# Patient Record
Sex: Female | Born: 1970 | Race: Black or African American | Hispanic: No | Smoking: Never smoker
Health system: Southern US, Community
[De-identification: ages and names within clinical notes are randomized; demographics above are authoritative.]

## PROBLEM LIST (undated history)

## (undated) DIAGNOSIS — E669 Obesity, unspecified: Secondary | ICD-10-CM

## (undated) DIAGNOSIS — I10 Essential (primary) hypertension: Secondary | ICD-10-CM

## (undated) HISTORY — PX: REDUCTION MAMMAPLASTY: SUR839

## (undated) HISTORY — DX: Essential (primary) hypertension: I10

## (undated) HISTORY — DX: Obesity, unspecified: E66.9

## (undated) HISTORY — PX: BREAST REDUCTION SURGERY: SHX8

---

## 1999-08-21 ENCOUNTER — Ambulatory Visit (HOSPITAL_BASED_OUTPATIENT_CLINIC_OR_DEPARTMENT_OTHER): Admission: RE | Admit: 1999-08-21 | Discharge: 1999-08-22 | Payer: Self-pay | Admitting: Specialist

## 1999-10-17 ENCOUNTER — Other Ambulatory Visit: Admission: RE | Admit: 1999-10-17 | Discharge: 1999-10-17 | Payer: Self-pay | Admitting: Obstetrics & Gynecology

## 2000-05-08 ENCOUNTER — Encounter: Admission: RE | Admit: 2000-05-08 | Discharge: 2000-05-15 | Payer: Self-pay | Admitting: Family Medicine

## 2000-11-26 ENCOUNTER — Other Ambulatory Visit: Admission: RE | Admit: 2000-11-26 | Discharge: 2000-11-26 | Payer: Self-pay | Admitting: Obstetrics & Gynecology

## 2001-12-02 ENCOUNTER — Other Ambulatory Visit: Admission: RE | Admit: 2001-12-02 | Discharge: 2001-12-02 | Payer: Self-pay | Admitting: Obstetrics & Gynecology

## 2001-12-05 ENCOUNTER — Encounter: Payer: Self-pay | Admitting: Obstetrics & Gynecology

## 2001-12-05 ENCOUNTER — Encounter: Admission: RE | Admit: 2001-12-05 | Discharge: 2001-12-05 | Payer: Self-pay | Admitting: Obstetrics & Gynecology

## 2003-03-08 ENCOUNTER — Other Ambulatory Visit: Admission: RE | Admit: 2003-03-08 | Discharge: 2003-03-08 | Payer: Self-pay | Admitting: Obstetrics & Gynecology

## 2004-03-13 ENCOUNTER — Other Ambulatory Visit: Admission: RE | Admit: 2004-03-13 | Discharge: 2004-03-13 | Payer: Self-pay | Admitting: Obstetrics & Gynecology

## 2004-05-04 ENCOUNTER — Other Ambulatory Visit: Admission: RE | Admit: 2004-05-04 | Discharge: 2004-05-04 | Payer: Self-pay | Admitting: Obstetrics and Gynecology

## 2005-03-21 ENCOUNTER — Other Ambulatory Visit: Admission: RE | Admit: 2005-03-21 | Discharge: 2005-03-21 | Payer: Self-pay | Admitting: Obstetrics & Gynecology

## 2005-03-27 ENCOUNTER — Encounter: Admission: RE | Admit: 2005-03-27 | Discharge: 2005-03-27 | Payer: Self-pay | Admitting: Obstetrics & Gynecology

## 2011-04-02 ENCOUNTER — Encounter: Payer: Self-pay | Admitting: Family Medicine

## 2011-04-02 DIAGNOSIS — Z6837 Body mass index (BMI) 37.0-37.9, adult: Secondary | ICD-10-CM | POA: Insufficient documentation

## 2011-04-02 DIAGNOSIS — E669 Obesity, unspecified: Secondary | ICD-10-CM | POA: Insufficient documentation

## 2011-04-02 DIAGNOSIS — E66812 Obesity, class 2: Secondary | ICD-10-CM | POA: Insufficient documentation

## 2012-02-29 ENCOUNTER — Other Ambulatory Visit: Payer: Self-pay | Admitting: Obstetrics & Gynecology

## 2012-02-29 ENCOUNTER — Other Ambulatory Visit: Payer: Self-pay | Admitting: Internal Medicine

## 2012-02-29 DIAGNOSIS — Z9889 Other specified postprocedural states: Secondary | ICD-10-CM

## 2012-02-29 DIAGNOSIS — Z1231 Encounter for screening mammogram for malignant neoplasm of breast: Secondary | ICD-10-CM

## 2012-03-31 ENCOUNTER — Ambulatory Visit: Payer: Self-pay

## 2012-04-24 ENCOUNTER — Ambulatory Visit
Admission: RE | Admit: 2012-04-24 | Discharge: 2012-04-24 | Disposition: A | Discharge status: Home / Self Care | Payer: BC Managed Care – PPO | Source: Ambulatory Visit | Attending: Obstetrics & Gynecology | Admitting: Obstetrics & Gynecology

## 2012-04-24 DIAGNOSIS — Z9889 Other specified postprocedural states: Secondary | ICD-10-CM

## 2012-04-24 DIAGNOSIS — Z1231 Encounter for screening mammogram for malignant neoplasm of breast: Secondary | ICD-10-CM

## 2013-09-17 ENCOUNTER — Emergency Department (HOSPITAL_COMMUNITY)
Admission: EM | Admit: 2013-09-17 | Discharge: 2013-09-17 | Disposition: A | Discharge status: Home / Self Care | Payer: 59 | Source: Home / Self Care | Attending: Family Medicine | Admitting: Family Medicine

## 2013-09-17 ENCOUNTER — Encounter (HOSPITAL_COMMUNITY): Payer: Self-pay | Admitting: Emergency Medicine

## 2013-09-17 DIAGNOSIS — I10 Essential (primary) hypertension: Secondary | ICD-10-CM

## 2013-09-17 DIAGNOSIS — T7840XA Allergy, unspecified, initial encounter: Secondary | ICD-10-CM

## 2013-09-17 DIAGNOSIS — T50995A Adverse effect of other drugs, medicaments and biological substances, initial encounter: Secondary | ICD-10-CM

## 2013-09-17 MED ORDER — METHYLPREDNISOLONE ACETATE 40 MG/ML IJ SUSP
80.0000 mg | Freq: Once | INTRAMUSCULAR | Status: AC
  Administered 2013-09-17: 80 mg via INTRAMUSCULAR

## 2013-09-17 MED ORDER — METHYLPREDNISOLONE ACETATE 80 MG/ML IJ SUSP
INTRAMUSCULAR | Status: AC
  Filled 2013-09-17: qty 1

## 2013-09-17 NOTE — ED Notes (Signed)
generalised  Rash  For  2  Days  After  Taking  Clindamycin    Pt  Stopped  sev  Days         Also    Has  Been taking  Benadryl

## 2013-09-17 NOTE — ED Provider Notes (Signed)
CSN: [id number]     Arrival date & time 09/17/13  1329 History   First MD Initiated Contact with Patient 09/17/13 1504     Chief Complaint  Patient presents with  . Rash   (Consider location/radiation/quality/duration/timing/severity/associated sxs/prior Treatment) Patient is a 43 y.o. female presenting with rash. The history is provided by the patient, the spouse and a relative.  Rash Location:  Full body Quality: itchiness and redness   Severity:  Mild Onset quality:  Gradual Duration:  2 days Progression:  Unchanged Chronicity:  New Context: medications   Context comment:  Took clindamycin wed thru sun for flu sx, started with rash yest., no swelling or resp problems. Ineffective treatments:  Antihistamines Associated symptoms: no diarrhea, no nausea, no shortness of breath, no throat swelling, no tongue swelling and not wheezing     Past Medical History  Diagnosis Date  . Obesity    Past Surgical History  Procedure Laterality Date  . Breast reduction surgery     History reviewed. No pertinent family history. History  Substance Use Topics  . Smoking status: Never Smoker   . Smokeless tobacco: Not on file  . Alcohol Use: Yes   OB History   Grav Para Term Preterm Abortions TAB SAB Ect Mult Living                 Review of Systems  Constitutional: Negative.   HENT: Positive for trouble swallowing.   Respiratory: Negative for shortness of breath and wheezing.   Gastrointestinal: Negative for nausea and diarrhea.  Skin: Positive for rash.    Allergies  Review of patient's allergies indicates no known allergies.  Home Medications   Current Outpatient Rx  Name  Route  Sig  Dispense  Refill  . drospirenone-ethinyl estradiol (YASMIN) 3-0.03 MG per tablet   Oral   Take 1 tablet by mouth daily.           . phentermine 15 MG capsule   Oral   Take 15 mg by mouth every morning.            BP 130/95  Pulse 96  Temp(Src) 97.3 F (36.3 C) (Oral)  Resp 19   SpO2 97%  LMP 09/05/2013 Physical Exam  Nursing note and vitals reviewed. Constitutional: She is oriented to person, place, and time. She appears well-developed and well-nourished. No distress.  HENT:  Head: Normocephalic.  Right Ear: External ear normal.  Left Ear: External ear normal.  Mouth/Throat: Oropharynx is clear and moist.  Neck: Normal range of motion. Neck supple.  Cardiovascular: Regular rhythm.   Pulmonary/Chest: Effort normal and breath sounds normal. She has no wheezes.  Lymphadenopathy:    She has no cervical adenopathy.  Neurological: She is alert and oriented to person, place, and time.  Skin: Skin is warm and dry.    ED Course  Procedures (including critical care time) Labs Review Labs Reviewed - No data to display Imaging Review No results found.  EKG Interpretation    Date/Time:    Ventricular Rate:    PR Interval:    QRS Duration:   QT Interval:    QTC Calculation:   R Axis:     Text Interpretation:              MDM      Linna HoffJames D Marquon Alcala, MD 09/17/13 1520

## 2013-09-17 NOTE — Discharge Instructions (Signed)
Continue benadryl as needed, no more clindamycin, return as needed.

## 2017-02-25 DIAGNOSIS — I10 Essential (primary) hypertension: Secondary | ICD-10-CM | POA: Diagnosis not present

## 2017-02-25 DIAGNOSIS — Z6841 Body Mass Index (BMI) 40.0 and over, adult: Secondary | ICD-10-CM | POA: Diagnosis not present

## 2017-03-14 DIAGNOSIS — Z6837 Body mass index (BMI) 37.0-37.9, adult: Secondary | ICD-10-CM | POA: Diagnosis not present

## 2017-03-14 DIAGNOSIS — Z01419 Encounter for gynecological examination (general) (routine) without abnormal findings: Secondary | ICD-10-CM | POA: Diagnosis not present

## 2017-04-11 DIAGNOSIS — Z6838 Body mass index (BMI) 38.0-38.9, adult: Secondary | ICD-10-CM | POA: Diagnosis not present

## 2017-04-11 DIAGNOSIS — Z30430 Encounter for insertion of intrauterine contraceptive device: Secondary | ICD-10-CM | POA: Diagnosis not present

## 2017-04-18 DIAGNOSIS — Z6828 Body mass index (BMI) 28.0-28.9, adult: Secondary | ICD-10-CM | POA: Diagnosis not present

## 2017-04-18 DIAGNOSIS — Z1231 Encounter for screening mammogram for malignant neoplasm of breast: Secondary | ICD-10-CM | POA: Diagnosis not present

## 2017-06-24 DIAGNOSIS — Z920 Personal history of contraception: Secondary | ICD-10-CM | POA: Diagnosis not present

## 2017-06-24 DIAGNOSIS — R7309 Other abnormal glucose: Secondary | ICD-10-CM | POA: Diagnosis not present

## 2017-06-24 DIAGNOSIS — E559 Vitamin D deficiency, unspecified: Secondary | ICD-10-CM | POA: Diagnosis not present

## 2017-06-24 DIAGNOSIS — Z6841 Body Mass Index (BMI) 40.0 and over, adult: Secondary | ICD-10-CM | POA: Diagnosis not present

## 2017-06-24 DIAGNOSIS — E669 Obesity, unspecified: Secondary | ICD-10-CM | POA: Diagnosis not present

## 2017-06-24 DIAGNOSIS — Z975 Presence of (intrauterine) contraceptive device: Secondary | ICD-10-CM | POA: Diagnosis not present

## 2017-06-24 DIAGNOSIS — Z23 Encounter for immunization: Secondary | ICD-10-CM | POA: Diagnosis not present

## 2017-06-24 DIAGNOSIS — I10 Essential (primary) hypertension: Secondary | ICD-10-CM | POA: Diagnosis not present

## 2017-08-21 DIAGNOSIS — J019 Acute sinusitis, unspecified: Secondary | ICD-10-CM | POA: Diagnosis not present

## 2017-08-21 DIAGNOSIS — R05 Cough: Secondary | ICD-10-CM | POA: Diagnosis not present

## 2017-08-30 DIAGNOSIS — Z6841 Body Mass Index (BMI) 40.0 and over, adult: Secondary | ICD-10-CM | POA: Diagnosis not present

## 2017-11-19 DIAGNOSIS — I10 Essential (primary) hypertension: Secondary | ICD-10-CM | POA: Diagnosis not present

## 2017-11-19 DIAGNOSIS — E669 Obesity, unspecified: Secondary | ICD-10-CM | POA: Diagnosis not present

## 2017-11-19 DIAGNOSIS — E559 Vitamin D deficiency, unspecified: Secondary | ICD-10-CM | POA: Diagnosis not present

## 2017-11-19 DIAGNOSIS — Z0000 Encounter for general adult medical examination without abnormal findings: Secondary | ICD-10-CM | POA: Diagnosis not present

## 2017-11-19 DIAGNOSIS — Z6839 Body mass index (BMI) 39.0-39.9, adult: Secondary | ICD-10-CM | POA: Diagnosis not present

## 2018-02-05 DIAGNOSIS — R7309 Other abnormal glucose: Secondary | ICD-10-CM | POA: Diagnosis not present

## 2018-02-05 DIAGNOSIS — Z6839 Body mass index (BMI) 39.0-39.9, adult: Secondary | ICD-10-CM | POA: Diagnosis not present

## 2018-02-05 DIAGNOSIS — I10 Essential (primary) hypertension: Secondary | ICD-10-CM | POA: Diagnosis not present

## 2018-04-18 DIAGNOSIS — I10 Essential (primary) hypertension: Secondary | ICD-10-CM | POA: Diagnosis not present

## 2018-04-18 DIAGNOSIS — E559 Vitamin D deficiency, unspecified: Secondary | ICD-10-CM | POA: Diagnosis not present

## 2018-04-18 DIAGNOSIS — R7309 Other abnormal glucose: Secondary | ICD-10-CM | POA: Diagnosis not present

## 2018-04-18 LAB — BASIC METABOLIC PANEL
BUN: 12 (ref 4–21)
Creatinine: 0.8 (ref 0.5–1.1)
GLUCOSE: 81
Potassium: 4.3 (ref 3.4–5.3)
SODIUM: 140 (ref 137–147)

## 2018-04-18 LAB — CBC AND DIFFERENTIAL
HCT: 40 (ref 36–46)
HEMOGLOBIN: 12.5 (ref 12.0–16.0)
WBC: 8.3

## 2018-04-18 LAB — HEMOGLOBIN A1C: Hemoglobin A1C: 5.8

## 2018-05-30 DIAGNOSIS — R0981 Nasal congestion: Secondary | ICD-10-CM | POA: Diagnosis not present

## 2018-05-30 DIAGNOSIS — J029 Acute pharyngitis, unspecified: Secondary | ICD-10-CM | POA: Diagnosis not present

## 2018-05-30 DIAGNOSIS — T7849XA Other allergy, initial encounter: Secondary | ICD-10-CM | POA: Diagnosis not present

## 2018-06-14 ENCOUNTER — Encounter: Payer: Self-pay | Admitting: Nurse Practitioner

## 2018-06-14 DIAGNOSIS — I10 Essential (primary) hypertension: Secondary | ICD-10-CM | POA: Insufficient documentation

## 2018-06-14 DIAGNOSIS — R7309 Other abnormal glucose: Secondary | ICD-10-CM | POA: Insufficient documentation

## 2018-06-14 DIAGNOSIS — E559 Vitamin D deficiency, unspecified: Secondary | ICD-10-CM | POA: Insufficient documentation

## 2018-06-23 ENCOUNTER — Ambulatory Visit (INDEPENDENT_AMBULATORY_CARE_PROVIDER_SITE_OTHER): Payer: BLUE CROSS/BLUE SHIELD | Admitting: Nurse Practitioner

## 2018-06-23 ENCOUNTER — Encounter: Payer: Self-pay | Admitting: Nurse Practitioner

## 2018-06-23 VITALS — BP 126/88 | HR 93 | Temp 98.4°F | Ht 67.0 in | Wt 238.2 lb

## 2018-06-23 DIAGNOSIS — E669 Obesity, unspecified: Secondary | ICD-10-CM

## 2018-06-23 DIAGNOSIS — Z6837 Body mass index (BMI) 37.0-37.9, adult: Secondary | ICD-10-CM

## 2018-06-23 DIAGNOSIS — R7309 Other abnormal glucose: Secondary | ICD-10-CM

## 2018-06-23 DIAGNOSIS — E66812 Obesity, class 2: Secondary | ICD-10-CM

## 2018-06-23 DIAGNOSIS — I10 Essential (primary) hypertension: Secondary | ICD-10-CM | POA: Diagnosis not present

## 2018-06-23 DIAGNOSIS — Z23 Encounter for immunization: Secondary | ICD-10-CM | POA: Diagnosis not present

## 2018-06-23 MED ORDER — LIRAGLUTIDE -WEIGHT MANAGEMENT 18 MG/3ML ~~LOC~~ SOPN: 3.0000 mg | PEN_INJECTOR | Freq: Every day | SUBCUTANEOUS | 1 refills | Status: DC

## 2018-06-23 MED ORDER — TELMISARTAN-HCTZ 40-12.5 MG PO TABS: 1.0000 | ORAL_TABLET | Freq: Every day | ORAL | 1 refills | Status: DC

## 2018-06-23 NOTE — Progress Notes (Signed)
  Subjective:     Patient ID: Jane Armstrong , female    DOB: 01-03-1971 , 47 y.o.   MRN: [id number]   Weight Check - 47 y/o female here for weight check.  Taking her Saxenda 3mg  and tolerating well.  Exercising 5 days per week with strength training.  Eating healthy diet with Herbalife (1 shake per day).  Drinking approximately 3 - 16 oz bottles per day.     Past Medical History:  Diagnosis Date  . Obesity       Current Outpatient Medications:  .  EPINEPHrine (EPIPEN 2-PAK) 0.3 mg/0.3 mL IJ SOAJ injection, Inject into the muscle once. Use as directed, Disp: , Rfl:  .  ibuprofen (ADVIL,MOTRIN) 800 MG tablet, Take 800 mg by mouth 3 (three) times daily., Disp: , Rfl:  .  Liraglutide -Weight Management (SAXENDA) 18 MG/3ML SOPN, Inject into the skin. Inject 3mg  by subcutaneous route everyday in the abdomen, thigh or upper arm, Disp: , Rfl:  .  telmisartan-hydrochlorothiazide (MICARDIS HCT) 40-12.5 MG tablet, Take 1 tablet by mouth daily. Take one tablet by oral route every day, Disp: , Rfl:  .  Vitamin D, Ergocalciferol, (DRISDOL) 50000 units CAPS capsule, Take 50,000 Units by mouth every 7 (seven) days., Disp: , Rfl:    Review of Systems  Constitutional: Negative.   HENT: Negative.   Eyes: Negative.   Respiratory: Negative.   Cardiovascular: Negative.   Musculoskeletal: Negative.   Skin: Negative.      Today's Vitals   06/23/18 0843  BP: 126/88  Pulse: 93  Temp: 98.4 F (36.9 C)  TempSrc: Oral  SpO2: 96%  Weight: 238 lb 3.2 oz (108 kg)  Height: 5\' 7"  (1.702 m)  PainSc: 0-No pain   Body mass index is 37.31 kg/m.   Objective:  Physical Exam  Constitutional: She appears well-developed and well-nourished.  HENT:  Head: Normocephalic.  Cardiovascular: Normal rate, regular rhythm, normal heart sounds and intact distal pulses.  Pulmonary/Chest: Effort normal and breath sounds normal.  Musculoskeletal: Normal range of motion.  Skin: Skin is warm and dry.         Assessment And Plan:     1. Essential hypertension  Chronic, fair control, diastolic slightly elevated. Had caffeinated coffee before her appt.   - telmisartan-hydrochlorothiazide (MICARDIS HCT) 40-12.5 MG tablet; Take 1 tablet by mouth daily. Take one tablet by oral route every day  Dispense: 90 tablet; Refill: 1  2. Abnormal glucose  Chronic, stable, no current medications.    Encouraged to increase her fiber intake  3. Class 2 obesity without serious comorbidity with body mass index (BMI) of 37.0 to 37.9 in adult, unspecified obesity type  Chronic, she has had a 5lb weight loss since her last visit  Continue regular exercise with strength training  Continue eating a healthy diet, using Herbalife as a meal replacement and diet supplement - Liraglutide -Weight Management (SAXENDA) 18 MG/3ML SOPN; Inject 3 mg into the skin daily. Inject 3mg  by subcutaneous route everyday in the abdomen, thigh or upper arm  Dispense: 5 pen; Refill: 1  4. Need for influenza vaccination  Influenza vaccine given in office.    Encouraged to take Tylenol as needed for fever or myalgias - Flu Vaccine QUAD 6+ mos PF IM (Fluarix Quad PF)   Arnette Felts, FNP

## 2018-06-23 NOTE — Patient Instructions (Signed)
1. Continue with your Saxenda and exercising regularly, congratulations on your 5lb weight loss

## 2018-07-01 DIAGNOSIS — Z6838 Body mass index (BMI) 38.0-38.9, adult: Secondary | ICD-10-CM | POA: Diagnosis not present

## 2018-07-01 DIAGNOSIS — Z1231 Encounter for screening mammogram for malignant neoplasm of breast: Secondary | ICD-10-CM | POA: Diagnosis not present

## 2018-07-03 ENCOUNTER — Telehealth: Payer: Self-pay

## 2018-07-03 ENCOUNTER — Encounter: Payer: Self-pay | Admitting: Nurse Practitioner

## 2018-07-03 NOTE — Telephone Encounter (Signed)
Patient notified that her letter has been completed and put in the mail today to be mailed to her as requested. YRL,RMA

## 2018-07-03 NOTE — Telephone Encounter (Signed)
I need a medically necessary note stating my health and BMI and that it is neccessary for weight loss. I need to get reimbursed for the gym membership i have been paying since June 1st 2019.  Please send it to me on portal or you can fax [phone] to my attention. Please advise YRL,RMA

## 2018-07-19 ENCOUNTER — Other Ambulatory Visit: Payer: Self-pay | Admitting: Nurse Practitioner

## 2018-08-09 ENCOUNTER — Other Ambulatory Visit: Payer: Self-pay | Admitting: Nurse Practitioner

## 2018-08-12 ENCOUNTER — Other Ambulatory Visit: Payer: Self-pay | Admitting: Nurse Practitioner

## 2018-08-12 DIAGNOSIS — E669 Obesity, unspecified: Secondary | ICD-10-CM

## 2018-08-12 DIAGNOSIS — Z6837 Body mass index (BMI) 37.0-37.9, adult: Principal | ICD-10-CM

## 2018-08-22 ENCOUNTER — Encounter: Payer: Self-pay | Admitting: Nurse Practitioner

## 2018-08-29 ENCOUNTER — Other Ambulatory Visit: Payer: Self-pay | Admitting: Nurse Practitioner

## 2018-09-12 ENCOUNTER — Encounter: Payer: Self-pay | Admitting: Nurse Practitioner

## 2018-09-20 ENCOUNTER — Encounter: Payer: Self-pay | Admitting: Nurse Practitioner

## 2018-09-22 ENCOUNTER — Other Ambulatory Visit: Payer: Self-pay | Admitting: Nurse Practitioner

## 2018-09-22 ENCOUNTER — Encounter: Payer: Self-pay | Admitting: Nurse Practitioner

## 2018-09-22 DIAGNOSIS — Z6837 Body mass index (BMI) 37.0-37.9, adult: Principal | ICD-10-CM

## 2018-09-22 DIAGNOSIS — E669 Obesity, unspecified: Secondary | ICD-10-CM

## 2018-09-22 MED ORDER — LIRAGLUTIDE -WEIGHT MANAGEMENT 18 MG/3ML ~~LOC~~ SOPN: 3.0000 mg | PEN_INJECTOR | Freq: Every day | SUBCUTANEOUS | 1 refills | Status: DC

## 2018-09-24 ENCOUNTER — Encounter: Payer: Self-pay | Admitting: Nurse Practitioner

## 2018-09-26 ENCOUNTER — Ambulatory Visit: Payer: BLUE CROSS/BLUE SHIELD | Admitting: Nurse Practitioner

## 2018-09-28 ENCOUNTER — Other Ambulatory Visit: Payer: Self-pay | Admitting: Nurse Practitioner

## 2018-09-30 ENCOUNTER — Encounter: Payer: Self-pay | Admitting: Nurse Practitioner

## 2018-09-30 ENCOUNTER — Telehealth: Payer: Self-pay

## 2018-09-30 NOTE — Telephone Encounter (Signed)
Patient notified that Jane Armstrong is not covered by her ins and that we will see if the ozempic is covered. Patient wants to know what you would like her to do in the meantime she is getting ready to need another pen this week? YRL,RMA

## 2018-10-01 ENCOUNTER — Encounter: Payer: Self-pay | Admitting: Nurse Practitioner

## 2018-10-01 NOTE — Telephone Encounter (Signed)
Please give her a sample of Saxenda.

## 2018-10-06 ENCOUNTER — Encounter: Payer: Self-pay | Admitting: Nurse Practitioner

## 2018-10-06 ENCOUNTER — Ambulatory Visit (INDEPENDENT_AMBULATORY_CARE_PROVIDER_SITE_OTHER): Payer: BLUE CROSS/BLUE SHIELD | Admitting: Nurse Practitioner

## 2018-10-06 ENCOUNTER — Other Ambulatory Visit: Payer: Self-pay

## 2018-10-06 VITALS — BP 120/72 | HR 83 | Temp 98.6°F | Ht 67.8 in | Wt 241.2 lb

## 2018-10-06 DIAGNOSIS — Z6837 Body mass index (BMI) 37.0-37.9, adult: Secondary | ICD-10-CM

## 2018-10-06 DIAGNOSIS — E669 Obesity, unspecified: Secondary | ICD-10-CM | POA: Diagnosis not present

## 2018-10-06 DIAGNOSIS — I10 Essential (primary) hypertension: Secondary | ICD-10-CM | POA: Diagnosis not present

## 2018-10-06 DIAGNOSIS — R7309 Other abnormal glucose: Secondary | ICD-10-CM | POA: Diagnosis not present

## 2018-10-06 MED ORDER — SEMAGLUTIDE(0.25 OR 0.5MG/DOS) 2 MG/1.5ML ~~LOC~~ SOPN: 0.5000 mg | PEN_INJECTOR | SUBCUTANEOUS | 3 refills | Status: DC

## 2018-10-06 NOTE — Progress Notes (Signed)
Subjective:     Patient ID: Jane Armstrong , female    DOB: 04/04/1971 , 48 y.o.   MRN: [id number]   Chief Complaint  Patient presents with  . Weight Check    HPI  Obesity - she is here today for weight check.  She had been on Saxenda until recently her insurance doesn't cover the medication.  She is exercising regularly 4 days per week.  She is eating mostly healthy diet, did have a cheat day.  She is drinking approximately 56 oz of water.   Hypertension  This is a chronic problem. The current episode started more than 1 year ago. The problem has been gradually improving since onset. The problem is controlled. Pertinent negatives include no anxiety, blurred vision, chest pain, headaches, palpitations or shortness of breath. There are no associated agents to hypertension. There are no compliance problems.  There is no history of angina.     Past Medical History:  Diagnosis Date  . Hypertension   . Obesity      Family History  Problem Relation Age of Onset  . Hypertension Mother   . Diabetes Mother      Current Outpatient Medications:  .  EPINEPHrine (EPIPEN 2-PAK) 0.3 mg/0.3 mL IJ SOAJ injection, Inject into the muscle once. Use as directed, Disp: , Rfl:  .  ibuprofen (ADVIL,MOTRIN) 800 MG tablet, TAKE 1 TABLET BY MOUTH 3 TIMES A DAY WITH FOOD AS NEEDED, Disp: 90 tablet, Rfl: 0 .  Liraglutide -Weight Management (SAXENDA) 18 MG/3ML SOPN, Inject 3 mg into the skin daily., Disp: 5 pen, Rfl: 1 .  losartan-hydrochlorothiazide (HYZAAR) 50-12.5 MG tablet, TAKE 1 TABLET BY MOUTH EVERY DAY (Patient not taking: Reported on 10/06/2018), Disp: 90 tablet, Rfl: 1 .  telmisartan-hydrochlorothiazide (MICARDIS HCT) 40-12.5 MG tablet, Take 1 tablet by mouth daily. Take one tablet by oral route every day, Disp: 90 tablet, Rfl: 1 .  Vitamin D, Ergocalciferol, (DRISDOL) 1.25 MG (50000 UT) CAPS capsule, TAKE ONE CAPSULE EVERY WEEK, Disp: 8 capsule, Rfl: 1   Allergies  Allergen Reactions  .  Clindamycin/Lincomycin Hives     Review of Systems  Constitutional: Negative.  Negative for fatigue.  Eyes: Negative for blurred vision and visual disturbance.  Respiratory: Negative.  Negative for shortness of breath.   Cardiovascular: Negative.  Negative for chest pain, palpitations and leg swelling.  Gastrointestinal: Negative.   Endocrine: Negative.  Negative for polydipsia.  Musculoskeletal: Negative.   Skin: Negative.   Neurological: Negative for dizziness, weakness and headaches.  Psychiatric/Behavioral: Negative for confusion. The patient is not nervous/anxious.      Today's Vitals   10/06/18 0906  BP: 120/72  Pulse: 83  Temp: 98.6 F (37 C)  TempSrc: Oral  SpO2: 97%  Weight: 241 lb 3.2 oz (109.4 kg)  Height: 5' 7.8" (1.722 m)   Body mass index is 36.89 kg/m.   Objective:  Physical Exam Vitals signs reviewed.  Constitutional:      Appearance: She is well-developed.  HENT:     Head: Normocephalic and atraumatic.  Eyes:     Pupils: Pupils are equal, round, and reactive to light.  Cardiovascular:     Rate and Rhythm: Normal rate and regular rhythm.     Pulses: Normal pulses.     Heart sounds: Normal heart sounds. No murmur.  Pulmonary:     Effort: Pulmonary effort is normal.     Breath sounds: Normal breath sounds.  Musculoskeletal:        General: Tenderness:  cervical and low back.  Skin:    General: Skin is warm and dry.     Capillary Refill: Capillary refill takes less than 2 seconds.  Neurological:     General: No focal deficit present.     Mental Status: She is alert and oriented to person, place, and time.     Cranial Nerves: No cranial nerve deficit.  Psychiatric:        Mood and Affect: Mood normal.        Behavior: Behavior normal.        Thought Content: Thought content normal.        Judgment: Judgment normal.         Assessment And Plan:     1. Abnormal glucose  Chronic, controlled  Continue with current medications  Encouraged  to limit intake of sugary foods and drinks  Encouraged to increase physical activity to 150 minutes per week - Semaglutide,0.25 or 0.'5MG'$ /DOS, (OZEMPIC, 0.25 OR 0.5 MG/DOSE,) 2 MG/1.5ML SOPN; Inject 0.5 mg into the skin once a week.  Dispense: 1 pen; Refill: 3 - BMP8+eGFR - Hemoglobin A1c  2. Essential hypertension . B/P is controlled.  . CMP ordered to check renal function.  . The importance of regular exercise and dietary modification was stressed to the patient.  . Stressed importance of losing ten percent of her body weight to help with B/P control.  . The weight loss would help with decreasing cardiac and cancer risk as well.  - BMP8+eGFR  3. Class 2 obesity without serious comorbidity with body mass index (BMI) of 37.0 to 37.9 in adult, unspecified obesity type  Chronic  Discussed healthy diet and regular exercise options   Encouraged to exercise at least 150 minutes per week with 2 days of strength training  Return in 2 months for weight check.     Minette Brine, FNP

## 2018-10-07 ENCOUNTER — Encounter: Payer: Self-pay | Admitting: Nurse Practitioner

## 2018-10-07 LAB — HEMOGLOBIN A1C
ESTIMATED AVERAGE GLUCOSE: 111 mg/dL
Hgb A1c MFr Bld: 5.5 % (ref 4.8–5.6)

## 2018-10-07 LAB — BMP8+EGFR
BUN/Creatinine Ratio: 14 (ref 9–23)
BUN: 10 mg/dL (ref 6–24)
CO2: 23 mmol/L (ref 20–29)
CREATININE: 0.74 mg/dL (ref 0.57–1.00)
Calcium: 9.5 mg/dL (ref 8.7–10.2)
Chloride: 102 mmol/L (ref 96–106)
GFR calc Af Amer: 112 mL/min/{1.73_m2} (ref >59)
GFR, EST NON AFRICAN AMERICAN: 97 mL/min/{1.73_m2} (ref >59)
GLUCOSE: 76 mg/dL (ref 65–99)
Potassium: 4.3 mmol/L (ref 3.5–5.2)
Sodium: 143 mmol/L (ref 134–144)

## 2018-10-08 ENCOUNTER — Encounter: Payer: Self-pay | Admitting: Nurse Practitioner

## 2018-10-09 ENCOUNTER — Other Ambulatory Visit: Payer: Self-pay | Admitting: Nurse Practitioner

## 2018-10-09 ENCOUNTER — Encounter: Payer: Self-pay | Admitting: Nurse Practitioner

## 2018-10-09 DIAGNOSIS — Z6837 Body mass index (BMI) 37.0-37.9, adult: Secondary | ICD-10-CM

## 2018-10-09 DIAGNOSIS — E669 Obesity, unspecified: Secondary | ICD-10-CM

## 2018-10-09 DIAGNOSIS — R7309 Other abnormal glucose: Secondary | ICD-10-CM

## 2018-10-09 MED ORDER — EXENATIDE ER 2 MG ~~LOC~~ PEN: 2.0000 mg | PEN_INJECTOR | SUBCUTANEOUS | 3 refills | Status: DC

## 2018-10-28 ENCOUNTER — Encounter: Payer: Self-pay | Admitting: Nurse Practitioner

## 2018-11-24 ENCOUNTER — Encounter: Payer: BLUE CROSS/BLUE SHIELD | Admitting: Nurse Practitioner

## 2018-12-08 ENCOUNTER — Other Ambulatory Visit: Payer: Self-pay

## 2018-12-08 ENCOUNTER — Encounter: Payer: Self-pay | Admitting: Nurse Practitioner

## 2018-12-08 ENCOUNTER — Ambulatory Visit (INDEPENDENT_AMBULATORY_CARE_PROVIDER_SITE_OTHER): Payer: BLUE CROSS/BLUE SHIELD | Admitting: Nurse Practitioner

## 2018-12-08 VITALS — BP 138/78 | HR 89 | Temp 98.6°F | Ht 67.8 in | Wt 230.4 lb

## 2018-12-08 DIAGNOSIS — I10 Essential (primary) hypertension: Secondary | ICD-10-CM | POA: Diagnosis not present

## 2018-12-08 DIAGNOSIS — E669 Obesity, unspecified: Secondary | ICD-10-CM

## 2018-12-08 DIAGNOSIS — Z0000 Encounter for general adult medical examination without abnormal findings: Secondary | ICD-10-CM | POA: Diagnosis not present

## 2018-12-08 DIAGNOSIS — R7303 Prediabetes: Secondary | ICD-10-CM

## 2018-12-08 DIAGNOSIS — Z6837 Body mass index (BMI) 37.0-37.9, adult: Secondary | ICD-10-CM

## 2018-12-08 DIAGNOSIS — M79671 Pain in right foot: Secondary | ICD-10-CM

## 2018-12-08 LAB — POCT URINALYSIS DIPSTICK
Bilirubin, UA: NEGATIVE
GLUCOSE UA: NEGATIVE
KETONES UA: NEGATIVE
Leukocytes, UA: NEGATIVE
NITRITE UA: NEGATIVE
Protein, UA: NEGATIVE
SPEC GRAV UA: 1.025 (ref 1.010–1.025)
Urobilinogen, UA: 1 E.U./dL
pH, UA: 5.5 (ref 5.0–8.0)

## 2018-12-08 LAB — POCT UA - MICROALBUMIN
Albumin/Creatinine Ratio, Urine, POC: <30
Creatinine, POC: 200 mg/dL
Microalbumin Ur, POC: 30 mg/L

## 2018-12-08 MED ORDER — IBUPROFEN 800 MG PO TABS: ORAL_TABLET | ORAL | 0 refills | Status: DC

## 2018-12-08 MED ORDER — TELMISARTAN-HCTZ 40-12.5 MG PO TABS: 1.0000 | ORAL_TABLET | Freq: Every day | ORAL | 1 refills | Status: DC

## 2018-12-08 MED ORDER — VITAMIN D (ERGOCALCIFEROL) 1.25 MG (50000 UNIT) PO CAPS: ORAL_CAPSULE | ORAL | 1 refills | Status: DC

## 2018-12-08 NOTE — Patient Instructions (Addendum)
Health Maintenance, Female Adopting a healthy lifestyle and getting preventive care can go a long way to promote health and wellness. Talk with your health care provider about what schedule of regular examinations is right for you. This is a good chance for you to check in with your provider about disease prevention and staying healthy. In between checkups, there are plenty of things you can do on your own. Experts have done a lot of research about which lifestyle changes and preventive measures are most likely to keep you healthy. Ask your health care provider for more information. Weight and diet Eat a healthy diet  Be sure to include plenty of vegetables, fruits, low-fat dairy products, and lean protein.  Do not eat a lot of foods high in solid fats, added sugars, or salt.  Get regular exercise. This is one of the most important things you can do for your health. ? Most adults should exercise for at least 150 minutes each week. The exercise should increase your heart rate and make you sweat (moderate-intensity exercise). ? Most adults should also do strengthening exercises at least twice a week. This is in addition to the moderate-intensity exercise. Maintain a healthy weight  Body mass index (BMI) is a measurement that can be used to identify possible weight problems. It estimates body fat based on height and weight. Your health care provider can help determine your BMI and help you achieve or maintain a healthy weight.  For females 20 years of age and older: ? A BMI below 18.5 is considered underweight. ? A BMI of 18.5 to 24.9 is normal. ? A BMI of 25 to 29.9 is considered overweight. ? A BMI of 30 and above is considered obese. Watch levels of cholesterol and blood lipids  You should start having your blood tested for lipids and cholesterol at 48 years of age, then have this test every 5 years.  You may need to have your cholesterol levels checked more often if: ? Your lipid or  cholesterol levels are high. ? You are older than 48 years of age. ? You are at high risk for heart disease. Cancer screening Lung Cancer  Lung cancer screening is recommended for adults 55-80 years old who are at high risk for lung cancer because of a history of smoking.  A yearly low-dose CT scan of the lungs is recommended for people who: ? Currently smoke. ? Have quit within the past 15 years. ? Have at least a 30-pack-year history of smoking. A pack year is smoking an average of one pack of cigarettes a day for 1 year.  Yearly screening should continue until it has been 15 years since you quit.  Yearly screening should stop if you develop a health problem that would prevent you from having lung cancer treatment. Breast Cancer  Practice breast self-awareness. This means understanding how your breasts normally appear and feel.  It also means doing regular breast self-exams. Let your health care provider know about any changes, no matter how small.  If you are in your 20s or 30s, you should have a clinical breast exam (CBE) by a health care provider every 1-3 years as part of a regular health exam.  If you are 40 or older, have a CBE every year. Also consider having a breast X-ray (mammogram) every year.  If you have a family history of breast cancer, talk to your health care provider about genetic screening.  If you are at high risk for breast cancer, talk   to your health care provider about having an MRI and a mammogram every year.  Breast cancer gene (BRCA) assessment is recommended for women who have family members with BRCA-related cancers. BRCA-related cancers include: ? Breast. ? Ovarian. ? Tubal. ? Peritoneal cancers.  Results of the assessment will determine the need for genetic counseling and BRCA1 and BRCA2 testing. Cervical Cancer Your health care provider may recommend that you be screened regularly for cancer of the pelvic organs (ovaries, uterus, and vagina).  This screening involves a pelvic examination, including checking for microscopic changes to the surface of your cervix (Pap test). You may be encouraged to have this screening done every 3 years, beginning at age 21.  For women ages 30-65, health care providers may recommend pelvic exams and Pap testing every 3 years, or they may recommend the Pap and pelvic exam, combined with testing for human papilloma virus (HPV), every 5 years. Some types of HPV increase your risk of cervical cancer. Testing for HPV may also be done on women of any age with unclear Pap test results.  Other health care providers may not recommend any screening for nonpregnant women who are considered low risk for pelvic cancer and who do not have symptoms. Ask your health care provider if a screening pelvic exam is right for you.  If you have had past treatment for cervical cancer or a condition that could lead to cancer, you need Pap tests and screening for cancer for at least 20 years after your treatment. If Pap tests have been discontinued, your risk factors (such as having a new sexual partner) need to be reassessed to determine if screening should resume. Some women have medical problems that increase the chance of getting cervical cancer. In these cases, your health care provider may recommend more frequent screening and Pap tests. Colorectal Cancer  This type of cancer can be detected and often prevented.  Routine colorectal cancer screening usually begins at 48 years of age and continues through 48 years of age.  Your health care provider may recommend screening at an earlier age if you have risk factors for colon cancer.  Your health care provider may also recommend using home test kits to check for hidden blood in the stool.  A small camera at the end of a tube can be used to examine your colon directly (sigmoidoscopy or colonoscopy). This is done to check for the earliest forms of colorectal cancer.  Routine  screening usually begins at age 50.  Direct examination of the colon should be repeated every 5-10 years through 48 years of age. However, you may need to be screened more often if early forms of precancerous polyps or small growths are found. Skin Cancer  Check your skin from head to toe regularly.  Tell your health care provider about any new moles or changes in moles, especially if there is a change in a mole's shape or color.  Also tell your health care provider if you have a mole that is larger than the size of a pencil eraser.  Always use sunscreen. Apply sunscreen liberally and repeatedly throughout the day.  Protect yourself by wearing long sleeves, pants, a wide-brimmed hat, and sunglasses whenever you are outside. Heart disease, diabetes, and high blood pressure  High blood pressure causes heart disease and increases the risk of stroke. High blood pressure is more likely to develop in: ? People who have blood pressure in the high end of the normal range (130-139/85-89 mm Hg). ? People   who are overweight or obese. ? People who are African American.  If you are 84-22 years of age, have your blood pressure checked every 3-5 years. If you are 67 years of age or older, have your blood pressure checked every year. You should have your blood pressure measured twice-once when you are at a hospital or clinic, and once when you are not at a hospital or clinic. Record the average of the two measurements. To check your blood pressure when you are not at a hospital or clinic, you can use: ? An automated blood pressure machine at a pharmacy. ? A home blood pressure monitor.  If you are between 52 years and 3 years old, ask your health care provider if you should take aspirin to prevent strokes.  Have regular diabetes screenings. This involves taking a blood sample to check your fasting blood sugar level. ? If you are at a normal weight and have a low risk for diabetes, have this test once  every three years after 48 years of age. ? If you are overweight and have a high risk for diabetes, consider being tested at a younger age or more often. Preventing infection Hepatitis B  If you have a higher risk for hepatitis B, you should be screened for this virus. You are considered at high risk for hepatitis B if: ? You were born in a country where hepatitis B is common. Ask your health care provider which countries are considered high risk. ? Your parents were born in a high-risk country, and you have not been immunized against hepatitis B (hepatitis B vaccine). ? You have HIV or AIDS. ? You use needles to inject street drugs. ? You live with someone who has hepatitis B. ? You have had sex with someone who has hepatitis B. ? You get hemodialysis treatment. ? You take certain medicines for conditions, including cancer, organ transplantation, and autoimmune conditions. Hepatitis C  Blood testing is recommended for: ? Everyone born from [birth year] through 1965. ? Anyone with known risk factors for hepatitis C. Sexually transmitted infections (STIs)  You should be screened for sexually transmitted infections (STIs) including gonorrhea and chlamydia if: ? You are sexually active and are younger than 49 years of age. ? You are older than 48 years of age and your health care provider tells you that you are at risk for this type of infection. ? Your sexual activity has changed since you were last screened and you are at an increased risk for chlamydia or gonorrhea. Ask your health care provider if you are at risk.  If you do not have HIV, but are at risk, it may be recommended that you take a prescription medicine daily to prevent HIV infection. This is called pre-exposure prophylaxis (PrEP). You are considered at risk if: ? You are sexually active and do not regularly use condoms or know the HIV status of your partner(s). ? You take drugs by injection. ? You are sexually active with a partner  who has HIV. Talk with your health care provider about whether you are at high risk of being infected with HIV. If you choose to begin PrEP, you should first be tested for HIV. You should then be tested every 3 months for as long as you are taking PrEP. Pregnancy  If you are premenopausal and you may become pregnant, ask your health care provider about preconception counseling.  If you may become pregnant, take 400 to 800 micrograms (mcg) of folic acid every  day.  If you want to prevent pregnancy, talk to your health care provider about birth control (contraception). Osteoporosis and menopause  Osteoporosis is a disease in which the bones lose minerals and strength with aging. This can result in serious bone fractures. Your risk for osteoporosis can be identified using a bone density scan.  If you are 26 years of age or older, or if you are at risk for osteoporosis and fractures, ask your health care provider if you should be screened.  Ask your health care provider whether you should take a calcium or vitamin D supplement to lower your risk for osteoporosis.  Menopause may have certain physical symptoms and risks.  Hormone replacement therapy may reduce some of these symptoms and risks. Talk to your health care provider about whether hormone replacement therapy is right for you. Follow these instructions at home:  Schedule regular health, dental, and eye exams.  Stay current with your immunizations.  Do not use any tobacco products including cigarettes, chewing tobacco, or electronic cigarettes.  If you are pregnant, do not drink alcohol.  If you are breastfeeding, limit how much and how often you drink alcohol.  Limit alcohol intake to no more than 1 drink per day for nonpregnant women. One drink equals 12 ounces of beer, 5 ounces of wine, or 1 ounces of hard liquor.  Do not use street drugs.  Do not share needles.  Ask your health care provider for help if you need support  or information about quitting drugs.  Tell your health care provider if you often feel depressed.  Tell your health care provider if you have ever been abused or do not feel safe at home. This information is not intended to replace advice given to you by your health care provider. Make sure you discuss any questions you have with your health care provider. Document Released: 03/19/2011 Document Revised: 02/09/2016 Document Reviewed: 06/07/2015 Elsevier Interactive Patient Education  2019 [place] ON YOUR WEIGHT LOSS, KEEP UP THE GOOD WORK   Plantar Fasciitis  Plantar fasciitis is a painful foot condition that affects the heel. It occurs when the band of tissue that connects the toes to the heel bone (plantar fascia) becomes irritated. This can happen as the result of exercising too much or doing other repetitive activities (overuse injury). The pain from plantar fasciitis can range from mild irritation to severe pain that makes it difficult to walk or move. The pain is usually worse in the morning after sleeping, or after sitting or lying down for a while. Pain may also be worse after long periods of walking or standing. What are the causes? This condition may be caused by:  Standing for long periods of time.  Wearing shoes that do not have good arch support.  Doing activities that put stress on joints (high-impact activities), including running, aerobics, and ballet.  Being overweight.  An abnormal way of walking (gait).  Tight muscles in the back of your lower leg (calf).  High arches in your feet.  Starting a new athletic activity. What are the signs or symptoms? The main symptom of this condition is heel pain. Pain may:  Be worse with first steps after a time of rest, especially in the morning after sleeping or after you have been sitting or lying down for a while.  Be worse after long periods of standing still.  Decrease after 30-45 minutes of  activity, such as gentle walking. How is this diagnosed? This  condition may be diagnosed based on your medical history and your symptoms. Your health care provider may ask questions about your activity level. Your health care provider will do a physical exam to check for:  A tender area on the bottom of your foot.  A high arch in your foot.  Pain when you move your foot.  Difficulty moving your foot. You may have imaging tests to confirm the diagnosis, such as:  X-rays.  Ultrasound.  MRI. How is this treated? Treatment for plantar fasciitis depends on how severe your condition is. Treatment may include:  Rest, ice, applying pressure (compression), and raising the affected foot (elevation). This may be called RICE therapy. Your health care provider may recommend RICE therapy along with over-the-counter pain medicines to manage your pain.  Exercises to stretch your calves and your plantar fascia.  A splint that holds your foot in a stretched, upward position while you sleep (night splint).  Physical therapy to relieve symptoms and prevent problems in the future.  Injections of steroid medicine (cortisone) to relieve pain and inflammation.  Stimulating your plantar fascia with electrical impulses (extracorporeal shock wave therapy). This is usually the last treatment option before surgery.  Surgery, if other treatments have not worked after 12 months. Follow these instructions at home:  Managing pain, stiffness, and swelling  If directed, put ice on the painful area: ? Put ice in a plastic bag, or use a frozen bottle of water. ? Place a towel between your skin and the bag or bottle. ? Roll the bottom of your foot over the bag or bottle. ? Do this for 20 minutes, 2-3 times a day.  Wear athletic shoes that have air-sole or gel-sole cushions, or try wearing soft shoe inserts that are designed for plantar fasciitis.  Raise (elevate) your foot above the level of your heart while  you are sitting or lying down. Activity  Avoid activities that cause pain. Ask your health care provider what activities are safe for you.  Do physical therapy exercises and stretches as told by your health care provider.  Try activities and forms of exercise that are easier on your joints (low-impact). Examples include swimming, water aerobics, and biking. General instructions  Take over-the-counter and prescription medicines only as told by your health care provider.  Wear a night splint while sleeping, if told by your health care provider. Loosen the splint if your toes tingle, become numb, or turn cold and blue.  Maintain a healthy weight, or work with your health care provider to lose weight as needed.  Keep all follow-up visits as told by your health care provider. This is important. Contact a health care provider if you:  Have symptoms that do not go away after caring for yourself at home.  Have pain that gets worse.  Have pain that affects your ability to move or do your daily activities. Summary  Plantar fasciitis is a painful foot condition that affects the heel. It occurs when the band of tissue that connects the toes to the heel bone (plantar fascia) becomes irritated.  The main symptom of this condition is heel pain that may be worse after exercising too much or standing still for a long time.  Treatment varies, but it usually starts with rest, ice, compression, and elevation (RICE therapy) and over-the-counter medicines to manage pain. This information is not intended to replace advice given to you by your health care provider. Make sure you discuss any questions you have with your health  care provider. Document Released: 05/29/2001 Document Revised: 07/01/2017 Document Reviewed: 07/01/2017 Elsevier Interactive Patient Education  2019 [employer].

## 2018-12-08 NOTE — Progress Notes (Signed)
Subjective:     Patient ID: Jane Armstrong , female    DOB: Nov 13, 1970 , 48 y.o.   MRN: [id number]   Chief Complaint  Patient presents with  . Annual Exam   The patient states she uses IUD for birth control. Last LMP was No LMP recorded. (Menstrual status: Irregular Periods).. Negative for Dysmenorrhea and Negative for Menorrhagia Mammogram last done 2019 Dr. Ilda Mori. Negative for: breast discharge, breast lump(s), breast pain and breast self exam.  Pertinent negatives include abnormal bleeding (hematology), anxiety, decreased libido, depression, difficulty falling sleep, dyspareunia, history of infertility, nocturia, sexual dysfunction, sleep disturbances, urinary incontinence, urinary urgency, vaginal discharge and vaginal itching. Diet regular, no starches after 2pm, she is drinking 4 bottles of water, Herbalife shakes once per day.The patient states her exercise level is  2 hours and 40 minutes (now doing exercises at home).      Wt Readings from Last 3 Encounters:  12/08/18 230 lb 6.4 oz (104.5 kg)  10/06/18 241 lb 3.2 oz (109.4 kg)  06/23/18 238 lb 3.2 oz (108 kg)   The patient's tobacco use is:  Social History   Tobacco Use  Smoking Status Never Smoker  Smokeless Tobacco Never Used   She has been exposed to passive smoke. The patient's alcohol use is:  Social History   Substance and Sexual Activity  Alcohol Use Yes   Comment: socially   Additional information: Last pap 2019 Dr. Ilda Mori, next one scheduled for 3 years.   HPI  Here for HM  Obesity - continues to exercise regularly.  She is currently not on any weight loss medications has been taking Ozempic weekly for her prediabetes.   Hypertension  This is a chronic problem. The current episode started more than 1 year ago. Progression since onset: has not been checking her blood sugar. There are no associated agents to hypertension. Risk factors for coronary artery disease include obesity and family  history. Past treatments include angiotensin blockers and diuretics. The current treatment provides no improvement. There are no compliance problems.  There is no history of chronic renal disease.  Diabetes  She presents for her follow-up diabetic visit. Diabetes type: prediabetes, has not been checking her blood sugar. Her disease course has been stable. There are no hypoglycemic associated symptoms. Pertinent negatives for diabetes include no polydipsia, no polyphagia and no polyuria. There are no hypoglycemic complications. There are no diabetic complications. Risk factors for coronary artery disease include obesity. Current diabetic treatment includes oral agent (monotherapy). She is compliant with treatment all of the time. She has not had a previous visit with a dietitian. She participates in exercise daily. An ACE inhibitor/angiotensin II receptor blocker is being taken. She does not see a podiatrist.Eye exam is current.     Past Medical History:  Diagnosis Date  . Hypertension   . Obesity      Family History  Problem Relation Age of Onset  . Hypertension Mother   . Diabetes Mother      Current Outpatient Medications:  .  EPINEPHrine (EPIPEN 2-PAK) 0.3 mg/0.3 mL IJ SOAJ injection, Inject into the muscle once. Use as directed, Disp: , Rfl:  .  Exenatide ER (BYDUREON) 2 MG PEN, Inject 2 mg into the skin once a week. (Patient not taking: Reported on 12/08/2018), Disp: 4 each, Rfl: 3 .  ibuprofen (ADVIL,MOTRIN) 800 MG tablet, TAKE 1 TABLET BY MOUTH 3 TIMES A DAY WITH FOOD AS NEEDED, Disp: 90 tablet, Rfl: 0 .  telmisartan-hydrochlorothiazide (MICARDIS  HCT) 40-12.5 MG tablet, Take 1 tablet by mouth daily. Take one tablet by oral route every day, Disp: 90 tablet, Rfl: 1 .  Vitamin D, Ergocalciferol, (DRISDOL) 1.25 MG (50000 UT) CAPS capsule, TAKE ONE CAPSULE EVERY WEEK, Disp: 8 capsule, Rfl: 1   Allergies  Allergen Reactions  . Clindamycin/Lincomycin Hives     Review of Systems   Constitutional: Negative.   HENT: Negative.   Eyes: Negative.   Respiratory: Negative.   Cardiovascular: Negative.   Gastrointestinal: Negative.   Endocrine: Negative.  Negative for polydipsia, polyphagia and polyuria.  Genitourinary: Negative.   Musculoskeletal: Negative.   Skin: Negative.   Allergic/Immunologic: Negative.   Neurological: Negative.   Hematological: Negative.   Psychiatric/Behavioral: Negative.      Today's Vitals   12/08/18 0841  BP: 138/78  Pulse: 89  Temp: 98.6 F (37 C)  TempSrc: Oral  SpO2: 96%  Weight: 230 lb 6.4 oz (104.5 kg)  Height: 5' 7.8" (1.722 m)   Body mass index is 35.24 kg/m.   Objective:  Physical Exam Vitals signs reviewed.  Constitutional:      Appearance: Normal appearance. She is well-developed.  HENT:     Head: Normocephalic and atraumatic.     Right Ear: Hearing, tympanic membrane, ear canal and external ear normal.     Left Ear: Hearing, tympanic membrane, ear canal and external ear normal.     Nose: Nose normal.     Mouth/Throat:     Mouth: Mucous membranes are moist.  Eyes:     General: Lids are normal.     Extraocular Movements: Extraocular movements intact.     Conjunctiva/sclera: Conjunctivae normal.     Pupils: Pupils are equal, round, and reactive to light.     Funduscopic exam:    Right eye: No papilledema.        Left eye: No papilledema.  Neck:     Musculoskeletal: Full passive range of motion without pain, normal range of motion and neck supple.     Thyroid: No thyroid mass.     Vascular: No carotid bruit.  Cardiovascular:     Rate and Rhythm: Normal rate and regular rhythm.     Pulses: Normal pulses.     Heart sounds: Normal heart sounds. No murmur.  Pulmonary:     Effort: Pulmonary effort is normal.     Breath sounds: Normal breath sounds.  Abdominal:     General: Abdomen is flat. Bowel sounds are normal.     Palpations: Abdomen is soft.  Musculoskeletal: Normal range of motion.        General: No  swelling.     Right lower leg: No edema.     Left lower leg: No edema.  Skin:    General: Skin is warm and dry.     Capillary Refill: Capillary refill takes less than 2 seconds.  Neurological:     General: No focal deficit present.     Mental Status: She is alert and oriented to person, place, and time.     Cranial Nerves: No cranial nerve deficit.     Sensory: No sensory deficit.  Psychiatric:        Mood and Affect: Mood normal.        Behavior: Behavior normal.        Thought Content: Thought content normal.        Judgment: Judgment normal.         Assessment And Plan:     1. Health maintenance  examination Behavior modifications discussed and diet history reviewed.   Pt will continue to exercise regularly and modify diet with low GI, plant based foods and decrease intake of processed foods.  Recommend intake of daily multivitamin, Vitamin D, and calcium.  Recommend mammogram (she has done at Dr. Arlyce Dice) for preventive screenings, as well as recommend immunizations that include influenza, TDAP - HIV antibody (with reflex) - Lipid panel - CMP14 + Anion Gap - Hemoglobin A1c  2. Essential hypertension . B/P is controlled.  . CMP ordered to check renal function.  . The importance of regular exercise and dietary modification was stressed to the patient.  . Stressed importance of losing ten percent of her body weight to help with B/P control.  . The weight loss would help with decreasing cardiac and cancer risk as well.   3. Class 2 obesity without serious comorbidity with body mass index (BMI) of 37.0 to 37.9 in adult, unspecified obesity type  She is doing great, she has lost 11 lbs   4. Right foot pain  Likely plantar fascitis  5. Prediabetes  Chronic, stable  Encouraged to limit intake of sugary foods and drinks  Encouraged to increase physical activity to 150 minutes per week - Hemoglobin A1c   Arnette Felts, FNP

## 2018-12-09 LAB — CMP14 + ANION GAP
ALK PHOS: 65 IU/L (ref 39–117)
ALT: 16 IU/L (ref 0–32)
AST: 19 IU/L (ref 0–40)
Albumin/Globulin Ratio: 1.6 (ref 1.2–2.2)
Albumin: 4.6 g/dL (ref 3.8–4.8)
Anion Gap: 14 mmol/L (ref 10.0–18.0)
BUN/Creatinine Ratio: 16 (ref 9–23)
BUN: 13 mg/dL (ref 6–24)
Bilirubin Total: 0.7 mg/dL (ref 0.0–1.2)
CO2: 23 mmol/L (ref 20–29)
CREATININE: 0.82 mg/dL (ref 0.57–1.00)
Calcium: 9.7 mg/dL (ref 8.7–10.2)
Chloride: 101 mmol/L (ref 96–106)
GFR calc Af Amer: 99 mL/min/{1.73_m2} (ref >59)
GFR, EST NON AFRICAN AMERICAN: 85 mL/min/{1.73_m2} (ref >59)
Globulin, Total: 2.9 g/dL (ref 1.5–4.5)
Glucose: 74 mg/dL (ref 65–99)
Potassium: 4.1 mmol/L (ref 3.5–5.2)
SODIUM: 138 mmol/L (ref 134–144)
Total Protein: 7.5 g/dL (ref 6.0–8.5)

## 2018-12-09 LAB — LIPID PANEL
Chol/HDL Ratio: 2 ratio (ref 0.0–4.4)
Cholesterol, Total: 116 mg/dL (ref 100–199)
HDL: 58 mg/dL (ref >39)
LDL Calculated: 48 mg/dL (ref 0–99)
Triglycerides: 49 mg/dL (ref 0–149)
VLDL CHOLESTEROL CAL: 10 mg/dL (ref 5–40)

## 2018-12-09 LAB — HEMOGLOBIN A1C
Est. average glucose Bld gHb Est-mCnc: 108 mg/dL
Hgb A1c MFr Bld: 5.4 % (ref 4.8–5.6)

## 2018-12-09 LAB — HIV ANTIBODY (ROUTINE TESTING W REFLEX): HIV Screen 4th Generation wRfx: NONREACTIVE

## 2018-12-10 ENCOUNTER — Encounter: Payer: Self-pay | Admitting: Nurse Practitioner

## 2018-12-10 MED ORDER — SEMAGLUTIDE(0.25 OR 0.5MG/DOS) 2 MG/1.5ML ~~LOC~~ SOPN: 0.5000 mg | PEN_INJECTOR | SUBCUTANEOUS | 6 refills | Status: DC

## 2018-12-12 ENCOUNTER — Encounter: Payer: Self-pay | Admitting: Nurse Practitioner

## 2018-12-18 ENCOUNTER — Other Ambulatory Visit: Payer: Self-pay | Admitting: Nurse Practitioner

## 2018-12-18 DIAGNOSIS — E669 Obesity, unspecified: Secondary | ICD-10-CM

## 2018-12-18 DIAGNOSIS — Z6837 Body mass index (BMI) 37.0-37.9, adult: Principal | ICD-10-CM

## 2018-12-18 MED ORDER — LIRAGLUTIDE -WEIGHT MANAGEMENT 18 MG/3ML ~~LOC~~ SOPN: 3.0000 mg | PEN_INJECTOR | Freq: Every day | SUBCUTANEOUS | 1 refills | Status: DC

## 2018-12-19 ENCOUNTER — Other Ambulatory Visit: Payer: Self-pay

## 2018-12-19 DIAGNOSIS — Z6837 Body mass index (BMI) 37.0-37.9, adult: Principal | ICD-10-CM

## 2018-12-19 DIAGNOSIS — E669 Obesity, unspecified: Secondary | ICD-10-CM

## 2018-12-19 MED ORDER — LIRAGLUTIDE -WEIGHT MANAGEMENT 18 MG/3ML ~~LOC~~ SOPN: 3.0000 mg | PEN_INJECTOR | Freq: Every day | SUBCUTANEOUS | 2 refills | Status: DC

## 2019-01-01 ENCOUNTER — Encounter: Payer: Self-pay | Admitting: Nurse Practitioner

## 2019-01-01 ENCOUNTER — Other Ambulatory Visit: Payer: Self-pay

## 2019-01-01 MED ORDER — CETIRIZINE HCL 10 MG PO TABS: 10.0000 mg | ORAL_TABLET | Freq: Every day | ORAL | 1 refills | Status: DC

## 2019-01-05 ENCOUNTER — Encounter: Payer: Self-pay | Admitting: Nurse Practitioner

## 2019-01-22 ENCOUNTER — Encounter: Payer: Self-pay | Admitting: Nurse Practitioner

## 2019-02-02 ENCOUNTER — Other Ambulatory Visit: Payer: Self-pay

## 2019-02-02 MED ORDER — VITAMIN D (ERGOCALCIFEROL) 1.25 MG (50000 UNIT) PO CAPS: ORAL_CAPSULE | ORAL | 0 refills | Status: DC

## 2019-02-16 ENCOUNTER — Encounter: Payer: Self-pay | Admitting: Nurse Practitioner

## 2019-02-16 ENCOUNTER — Ambulatory Visit (INDEPENDENT_AMBULATORY_CARE_PROVIDER_SITE_OTHER): Payer: BLUE CROSS/BLUE SHIELD | Admitting: Nurse Practitioner

## 2019-02-16 ENCOUNTER — Other Ambulatory Visit: Payer: Self-pay

## 2019-02-16 VITALS — BP 114/82 | HR 86 | Temp 98.8°F | Ht 67.0 in | Wt 218.0 lb

## 2019-02-16 DIAGNOSIS — R7303 Prediabetes: Secondary | ICD-10-CM | POA: Insufficient documentation

## 2019-02-16 DIAGNOSIS — R5383 Other fatigue: Secondary | ICD-10-CM | POA: Diagnosis not present

## 2019-02-16 DIAGNOSIS — E669 Obesity, unspecified: Secondary | ICD-10-CM | POA: Diagnosis not present

## 2019-02-16 DIAGNOSIS — Z6837 Body mass index (BMI) 37.0-37.9, adult: Secondary | ICD-10-CM | POA: Diagnosis not present

## 2019-02-16 NOTE — Progress Notes (Signed)
Subjective:     Patient ID: Jane Armstrong , female    DOB: [birth year] , 48 y.o.   MRN: [id number]   Chief Complaint  Patient presents with  . Weight Check    HPI  She is here today for obesity management.  She is now running and exercising more - every day. To keep herself busy.  She is still taking Victoza   Wt Readings from Last 3 Encounters: 02/16/19 : 218 lb (98.9 kg) 12/08/18 : 230 lb 6.4 oz (104.5 kg) 10/06/18 : 241 lb 3.2 oz (109.4 kg)  She is also feeling tired and exhausted.    Exhaustion - she is under increased stress and recently relocated from [place] to [place]. She was having some dizziness this am.  Thought to be related to her fatigue    Past Medical History:  Diagnosis Date  . Hypertension   . Obesity      Family History  Problem Relation Age of Onset  . Hypertension Mother   . Diabetes Mother      Current Outpatient Medications:  .  cetirizine (ZYRTEC) 10 MG tablet, Take 1 tablet (10 mg total) by mouth daily., Disp: 90 tablet, Rfl: 1 .  EPINEPHrine (EPIPEN 2-PAK) 0.3 mg/0.3 mL IJ SOAJ injection, Inject into the muscle once. Use as directed, Disp: , Rfl:  .  ibuprofen (ADVIL,MOTRIN) 800 MG tablet, TAKE 1 TABLET BY MOUTH 3 TIMES A DAY WITH FOOD AS NEEDED, Disp: 90 tablet, Rfl: 0 .  telmisartan-hydrochlorothiazide (MICARDIS HCT) 40-12.5 MG tablet, Take 1 tablet by mouth daily. Take one tablet by oral route every day, Disp: 90 tablet, Rfl: 1 .  Vitamin D, Ergocalciferol, (DRISDOL) 1.25 MG (50000 UT) CAPS capsule, TAKE ONE CAPSULE EVERY WEEK, Disp: 12 capsule, Rfl: 0 .  Exenatide ER (BYDUREON) 2 MG PEN, Inject 2 mg into the skin once a week. (Patient not taking: Reported on 12/08/2018), Disp: 4 each, Rfl: 3   Allergies  Allergen Reactions  . Clindamycin/Lincomycin Hives     Review of Systems  Constitutional: Negative.   Respiratory: Negative.   Cardiovascular: Negative.  Negative for chest pain, palpitations and leg swelling.   Endocrine: Negative for polydipsia, polyphagia and polyuria.  Musculoskeletal: Negative.   Neurological: Negative for dizziness and headaches.     Today's Vitals   02/16/19 0840  BP: 114/82  Pulse: 86  Temp: 98.8 F (37.1 C)  TempSrc: Oral  Weight: 218 lb (98.9 kg)  Height: 5\' 7"  (1.702 m)  PainSc: 0-No pain   Body mass index is 34.14 kg/m.   Objective:  Physical Exam Vitals signs reviewed.  Constitutional:      Appearance: Normal appearance.  Cardiovascular:     Rate and Rhythm: Normal rate and regular rhythm.     Pulses: Normal pulses.     Heart sounds: Normal heart sounds. No murmur.  Pulmonary:     Effort: Pulmonary effort is normal. No respiratory distress.     Breath sounds: Normal breath sounds.  Skin:    General: Skin is warm and dry.  Neurological:     General: No focal deficit present.     Mental Status: She is alert and oriented to person, place, and time.  Psychiatric:        Mood and Affect: Mood normal.        Behavior: Behavior normal.        Thought Content: Thought content normal.        Judgment: Judgment normal.  Assessment And Plan:     1. Class 2 obesity without serious comorbidity with body mass index (BMI) of 37.0 to 37.9 in adult, unspecified obesity type  Chronic  She has had 13 lb weight loss, congratulated on this  Continue with current physical activity level  2. 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

## 2019-02-27 ENCOUNTER — Encounter: Payer: Self-pay | Admitting: Nurse Practitioner

## 2019-04-13 ENCOUNTER — Encounter: Payer: Self-pay | Admitting: Nurse Practitioner

## 2019-04-13 NOTE — Telephone Encounter (Signed)
I will let her know, thanks!  

## 2019-04-23 ENCOUNTER — Encounter: Payer: Self-pay | Admitting: Nurse Practitioner

## 2019-04-23 ENCOUNTER — Other Ambulatory Visit: Payer: Self-pay

## 2019-04-23 DIAGNOSIS — I10 Essential (primary) hypertension: Secondary | ICD-10-CM

## 2019-04-23 MED ORDER — TELMISARTAN-HCTZ 40-12.5 MG PO TABS
1.0000 | ORAL_TABLET | Freq: Every day | ORAL | 0 refills | Status: DC
Start: 2019-04-23 — End: 2019-06-01

## 2019-06-01 ENCOUNTER — Ambulatory Visit (INDEPENDENT_AMBULATORY_CARE_PROVIDER_SITE_OTHER): Payer: BC Managed Care – PPO | Admitting: Nurse Practitioner

## 2019-06-01 ENCOUNTER — Other Ambulatory Visit: Payer: Self-pay

## 2019-06-01 ENCOUNTER — Encounter: Payer: Self-pay | Admitting: Nurse Practitioner

## 2019-06-01 VITALS — BP 122/82 | HR 68 | Temp 98.2°F | Ht 67.0 in | Wt 218.8 lb

## 2019-06-01 DIAGNOSIS — R7309 Other abnormal glucose: Secondary | ICD-10-CM | POA: Diagnosis not present

## 2019-06-01 DIAGNOSIS — Z6837 Body mass index (BMI) 37.0-37.9, adult: Secondary | ICD-10-CM

## 2019-06-01 DIAGNOSIS — E669 Obesity, unspecified: Secondary | ICD-10-CM | POA: Diagnosis not present

## 2019-06-01 DIAGNOSIS — E559 Vitamin D deficiency, unspecified: Secondary | ICD-10-CM | POA: Diagnosis not present

## 2019-06-01 DIAGNOSIS — I10 Essential (primary) hypertension: Secondary | ICD-10-CM

## 2019-06-01 DIAGNOSIS — Z23 Encounter for immunization: Secondary | ICD-10-CM

## 2019-06-01 MED ORDER — TELMISARTAN-HCTZ 40-12.5 MG PO TABS: 1.0000 | ORAL_TABLET | Freq: Every day | ORAL | 1 refills | Status: DC

## 2019-06-01 NOTE — Patient Instructions (Signed)
Influenza Virus Vaccine (Flucelvax) What is this medicine? INFLUENZA VIRUS VACCINE (in floo EN zuh VAHY ruhs vak SEEN) helps to reduce the risk of getting influenza also known as the flu. The vaccine only helps protect you against some strains of the flu. This medicine may be used for other purposes; ask your health care provider or pharmacist if you have questions. COMMON BRAND NAME(S): FLUCELVAX What should I tell my health care provider before I take this medicine? They need to know if you have any of these conditions:  bleeding disorder like hemophilia  fever or infection  Guillain-Barre syndrome or other neurological problems  immune system problems  infection with the human immunodeficiency virus (HIV) or AIDS  low blood platelet counts  multiple sclerosis  an unusual or allergic reaction to influenza virus vaccine, other medicines, foods, dyes or preservatives  pregnant or trying to get pregnant  breast-feeding How should I use this medicine? This vaccine is for injection into a muscle. It is given by a health care professional. A copy of Vaccine Information Statements will be given before each vaccination. Read this sheet carefully each time. The sheet may change frequently. Talk to your pediatrician regarding the use of this medicine in children. Special care may be needed. Overdosage: If you think you've taken too much of this medicine contact a poison control center or emergency room at once. Overdosage: If you think you have taken too much of this medicine contact a poison control center or emergency room at once. NOTE: This medicine is only for you. Do not share this medicine with others. What if I miss a dose? This does not apply. What may interact with this medicine?  chemotherapy or radiation therapy  medicines that lower your immune system like etanercept, anakinra, infliximab, and adalimumab  medicines that treat or prevent blood clots like  warfarin  phenytoin  steroid medicines like prednisone or cortisone  theophylline  vaccines This list may not describe all possible interactions. Give your health care provider a list of all the medicines, herbs, non-prescription drugs, or dietary supplements you use. Also tell them if you smoke, drink alcohol, or use illegal drugs. Some items may interact with your medicine. What should I watch for while using this medicine? Report any side effects that do not go away within 3 days to your doctor or health care professional. Call your health care provider if any unusual symptoms occur within 6 weeks of receiving this vaccine. You may still catch the flu, but the illness is not usually as bad. You cannot get the flu from the vaccine. The vaccine will not protect against colds or other illnesses that may cause fever. The vaccine is needed every year. What side effects may I notice from receiving this medicine? Side effects that you should report to your doctor or health care professional as soon as possible:  allergic reactions like skin rash, itching or hives, swelling of the face, lips, or tongue Side effects that usually do not require medical attention (Report these to your doctor or health care professional if they continue or are bothersome.):  fever  headache  muscle aches and pains  pain, tenderness, redness, or swelling at the injection site  tiredness This list may not describe all possible side effects. Call your doctor for medical advice about side effects. You may report side effects to FDA at 1-800-FDA-1088. Where should I keep my medicine? The vaccine will be given by a health care professional in a clinic, pharmacy, doctor's   office, or other health care setting. You will not be given vaccine doses to store at home. NOTE: This sheet is a summary. It may not cover all possible information. If you have questions about this medicine, talk to your doctor, pharmacist, or  health care provider.  2020 Elsevier/Gold Standard (2011-08-15 14:06:47)  

## 2019-06-01 NOTE — Progress Notes (Signed)
Subjective:     Patient ID: Jane Armstrong , female    DOB: 02/26/1971 , 48 y.o.   MRN: [id number]   Chief Complaint  Patient presents with  . Weight Check    HPI  She is here today for obesity management.  She is now running and exercising more - every day. She is doing more weight training.  She states "I feel good".  No current medications.  Her [place] scale says she had 28.2 and in August is 27.9%.  She is walking and running every 2-3 Saturdays.  Light jog interval of 3-4 miles per week.  Treats herself with a meal on Friday and Saturday.  Sunday she will get back on track.    Wt Readings from Last 3 Encounters: 06/01/19 : 218 lb 12.8 oz (99.2 kg) 02/16/19 : 218 lb (98.9 kg) 12/08/18 : 230 lb 6.4 oz (104.5 kg)        Past Medical History:  Diagnosis Date  . Hypertension   . Obesity      Family History  Problem Relation Age of Onset  . Hypertension Mother   . Diabetes Mother      Current Outpatient Medications:  .  cetirizine (ZYRTEC) 10 MG tablet, Take 1 tablet (10 mg total) by mouth daily., Disp: 90 tablet, Rfl: 1 .  EPINEPHrine (EPIPEN 2-PAK) 0.3 mg/0.3 mL IJ SOAJ injection, Inject into the muscle once. Use as directed, Disp: , Rfl:  .  ibuprofen (ADVIL,MOTRIN) 800 MG tablet, TAKE 1 TABLET BY MOUTH 3 TIMES A DAY WITH FOOD AS NEEDED, Disp: 90 tablet, Rfl: 0 .  telmisartan-hydrochlorothiazide (MICARDIS HCT) 40-12.5 MG tablet, Take 1 tablet by mouth daily. Take one tablet by oral route every day, Disp: 90 tablet, Rfl: 0 .  Vitamin D, Ergocalciferol, (DRISDOL) 1.25 MG (50000 UT) CAPS capsule, TAKE ONE CAPSULE EVERY WEEK, Disp: 12 capsule, Rfl: 0   Allergies  Allergen Reactions  . Clindamycin/Lincomycin Hives     Review of Systems  Constitutional: Negative.  Negative for fatigue.  Respiratory: Negative.   Cardiovascular: Negative.  Negative for chest pain, palpitations and leg swelling.  Endocrine: Negative for polydipsia, polyphagia and polyuria.   Musculoskeletal: Negative.   Neurological: Negative for dizziness and headaches.  Psychiatric/Behavioral: Negative.      Today's Vitals   06/01/19 0845  BP: 122/82  Pulse: 68  Temp: 98.2 F (36.8 C)  TempSrc: Oral  Weight: 218 lb 12.8 oz (99.2 kg)  Height: 5\' 7"  (1.702 m)  PainSc: 0-No pain   Body mass index is 34.27 kg/m.   Objective:  Physical Exam Vitals signs reviewed.  Constitutional:      Appearance: Normal appearance.  Cardiovascular:     Rate and Rhythm: Normal rate and regular rhythm.     Pulses: Normal pulses.     Heart sounds: Normal heart sounds. No murmur.  Pulmonary:     Effort: Pulmonary effort is normal. No respiratory distress.     Breath sounds: Normal breath sounds.  Skin:    General: Skin is warm and dry.     Capillary Refill: Capillary refill takes less than 2 seconds.  Neurological:     General: No focal deficit present.     Mental Status: She is alert and oriented to person, place, and time.     Cranial Nerves: No cranial nerve deficit.  Psychiatric:        Mood and Affect: Mood normal.        Behavior: Behavior normal.  Thought Content: Thought content normal.        Judgment: Judgment normal.         Assessment And Plan:     1. Essential hypertension . B/P is controlled.  Marland Kitchen. BMP ordered to check renal function.   2. Abnormal glucose  Chronic  Stable.  Continue with current regimen  Will check Hgba1c.  3. Class 2 obesity without serious comorbidity with body mass index (BMI) of 37.0 to 37.9 in adult, unspecified obesity type  Chronic  Continue with exercising regularly, increase your cardio or intensity of cardio  She has maintained her weight.    4. Need for influenza vaccination  Influenza vaccine given in office  Advised to take Tylenol as needed for muscle aches or fever - Flu Vaccine QUAD 6+ mos PF IM (Fluarix Quad PF)    Arnette FeltsJanece Aubrii Sharpless, FNP    THE PATIENT IS ENCOURAGED TO PRACTICE SOCIAL DISTANCING  DUE TO THE COVID-19 PANDEMIC.

## 2019-06-02 LAB — VITAMIN D 25 HYDROXY (VIT D DEFICIENCY, FRACTURES): Vit D, 25-Hydroxy: 46.5 ng/mL (ref 30.0–100.0)

## 2019-06-02 LAB — BMP8+EGFR
BUN/Creatinine Ratio: 16 (ref 9–23)
BUN: 13 mg/dL (ref 6–24)
CO2: 21 mmol/L (ref 20–29)
Calcium: 9.8 mg/dL (ref 8.7–10.2)
Chloride: 102 mmol/L (ref 96–106)
Creatinine, Ser: 0.8 mg/dL (ref 0.57–1.00)
GFR calc Af Amer: 101 mL/min/{1.73_m2} (ref >59)
GFR calc non Af Amer: 87 mL/min/{1.73_m2} (ref >59)
Glucose: 82 mg/dL (ref 65–99)
Potassium: 4.2 mmol/L (ref 3.5–5.2)
Sodium: 142 mmol/L (ref 134–144)

## 2019-06-02 LAB — HEMOGLOBIN A1C
Est. average glucose Bld gHb Est-mCnc: 105 mg/dL
Hgb A1c MFr Bld: 5.3 % (ref 4.8–5.6)

## 2019-07-29 ENCOUNTER — Other Ambulatory Visit: Payer: Self-pay

## 2019-07-29 MED ORDER — CETIRIZINE HCL 10 MG PO TABS: 10.0000 mg | ORAL_TABLET | Freq: Every day | ORAL | 1 refills | Status: DC

## 2019-07-30 DIAGNOSIS — Z1159 Encounter for screening for other viral diseases: Secondary | ICD-10-CM | POA: Diagnosis not present

## 2019-08-21 DIAGNOSIS — Z1159 Encounter for screening for other viral diseases: Secondary | ICD-10-CM | POA: Diagnosis not present

## 2019-08-27 ENCOUNTER — Encounter: Payer: Self-pay | Admitting: Nurse Practitioner

## 2019-08-31 ENCOUNTER — Ambulatory Visit: Payer: BC Managed Care – PPO | Admitting: Nurse Practitioner

## 2019-09-07 DIAGNOSIS — Z1159 Encounter for screening for other viral diseases: Secondary | ICD-10-CM | POA: Diagnosis not present

## 2019-09-09 DIAGNOSIS — Z1231 Encounter for screening mammogram for malignant neoplasm of breast: Secondary | ICD-10-CM | POA: Diagnosis not present

## 2019-09-14 ENCOUNTER — Other Ambulatory Visit: Payer: Self-pay | Admitting: Obstetrics and Gynecology

## 2019-09-14 DIAGNOSIS — R928 Other abnormal and inconclusive findings on diagnostic imaging of breast: Secondary | ICD-10-CM

## 2019-09-21 ENCOUNTER — Encounter: Payer: Self-pay | Admitting: Nurse Practitioner

## 2019-09-21 ENCOUNTER — Ambulatory Visit: Payer: BC Managed Care – PPO | Admitting: Nurse Practitioner

## 2019-09-25 ENCOUNTER — Other Ambulatory Visit: Payer: Self-pay | Admitting: Obstetrics and Gynecology

## 2019-09-25 ENCOUNTER — Ambulatory Visit
Admission: RE | Admit: 2019-09-25 | Discharge: 2019-09-25 | Disposition: A | Discharge status: Home / Self Care | Payer: 59 | Source: Ambulatory Visit | Attending: Obstetrics and Gynecology | Admitting: Obstetrics and Gynecology

## 2019-09-25 ENCOUNTER — Other Ambulatory Visit: Payer: Self-pay

## 2019-09-25 DIAGNOSIS — R928 Other abnormal and inconclusive findings on diagnostic imaging of breast: Secondary | ICD-10-CM | POA: Diagnosis not present

## 2019-09-25 DIAGNOSIS — N6489 Other specified disorders of breast: Secondary | ICD-10-CM | POA: Diagnosis not present

## 2019-10-01 ENCOUNTER — Encounter: Payer: Self-pay | Admitting: Nurse Practitioner

## 2019-10-02 DIAGNOSIS — Z124 Encounter for screening for malignant neoplasm of cervix: Secondary | ICD-10-CM | POA: Diagnosis not present

## 2019-10-05 ENCOUNTER — Ambulatory Visit: Payer: BC Managed Care – PPO | Admitting: Nurse Practitioner

## 2019-10-05 ENCOUNTER — Encounter: Payer: Self-pay | Admitting: Nurse Practitioner

## 2019-10-05 DIAGNOSIS — Z6832 Body mass index (BMI) 32.0-32.9, adult: Secondary | ICD-10-CM | POA: Diagnosis not present

## 2019-10-05 DIAGNOSIS — Z01419 Encounter for gynecological examination (general) (routine) without abnormal findings: Secondary | ICD-10-CM | POA: Diagnosis not present

## 2019-10-05 DIAGNOSIS — Z124 Encounter for screening for malignant neoplasm of cervix: Secondary | ICD-10-CM | POA: Diagnosis not present

## 2019-10-05 LAB — HM PAP SMEAR: HM Pap smear: NORMAL

## 2019-10-12 ENCOUNTER — Ambulatory Visit: Payer: BC Managed Care – PPO | Admitting: Nurse Practitioner

## 2019-10-12 ENCOUNTER — Encounter: Payer: Self-pay | Admitting: Nurse Practitioner

## 2019-12-07 DIAGNOSIS — Z6833 Body mass index (BMI) 33.0-33.9, adult: Secondary | ICD-10-CM | POA: Diagnosis not present

## 2019-12-07 DIAGNOSIS — Z30431 Encounter for routine checking of intrauterine contraceptive device: Secondary | ICD-10-CM | POA: Diagnosis not present

## 2019-12-13 NOTE — Progress Notes (Deleted)
The patient states she uses none for birth control. Last LMP was No LMP recorded. (Menstrual status: Irregular Periods).. Negative for Dysmenorrhea and Negative for Menorrhagia Mammogram last done 09/25/2019 - repeat in 6 months Negative for: breast discharge, breast lump(s), breast pain and breast self exam.  Pertinent negatives include abnormal bleeding (hematology), anxiety, decreased libido, depression, difficulty falling sleep, dyspareunia, history of infertility, nocturia, sexual dysfunction, sleep disturbances, urinary incontinence, urinary urgency, vaginal discharge and vaginal itching. Diet regular.The patient states her exercise level is      The patient's tobacco use is:  Social History   Tobacco Use  Smoking Status Never Smoker  Smokeless Tobacco Never Used  . She has been exposed to passive smoke. The patient's alcohol use is:  Social History   Substance and Sexual Activity  Alcohol Use Yes   Comment: socially  . Additional information: Last pap ***, next one scheduled for ***.

## 2019-12-14 ENCOUNTER — Encounter: Payer: BLUE CROSS/BLUE SHIELD | Admitting: Nurse Practitioner

## 2019-12-14 ENCOUNTER — Encounter: Payer: Self-pay | Admitting: Nurse Practitioner

## 2020-01-11 ENCOUNTER — Encounter: Payer: Self-pay | Admitting: Nurse Practitioner

## 2020-01-12 ENCOUNTER — Encounter: Payer: BC Managed Care – PPO | Admitting: Nurse Practitioner

## 2020-01-27 ENCOUNTER — Other Ambulatory Visit: Payer: Self-pay | Admitting: Nurse Practitioner

## 2020-01-27 DIAGNOSIS — I10 Essential (primary) hypertension: Secondary | ICD-10-CM

## 2020-01-28 ENCOUNTER — Other Ambulatory Visit: Payer: Self-pay | Admitting: Nurse Practitioner

## 2020-05-09 ENCOUNTER — Ambulatory Visit
Admission: RE | Admit: 2020-05-09 | Discharge: 2020-05-09 | Disposition: A | Discharge status: Home / Self Care | Payer: BC Managed Care – PPO | Source: Ambulatory Visit | Attending: Obstetrics and Gynecology | Admitting: Obstetrics and Gynecology

## 2020-05-09 ENCOUNTER — Other Ambulatory Visit: Payer: Self-pay

## 2020-05-09 ENCOUNTER — Other Ambulatory Visit: Payer: Self-pay | Admitting: Obstetrics and Gynecology

## 2020-05-09 DIAGNOSIS — R928 Other abnormal and inconclusive findings on diagnostic imaging of breast: Secondary | ICD-10-CM | POA: Diagnosis not present

## 2020-05-09 DIAGNOSIS — N6489 Other specified disorders of breast: Secondary | ICD-10-CM

## 2020-07-12 ENCOUNTER — Encounter: Payer: Self-pay | Admitting: Nurse Practitioner

## 2020-07-12 ENCOUNTER — Other Ambulatory Visit: Payer: Self-pay

## 2020-07-12 ENCOUNTER — Ambulatory Visit (INDEPENDENT_AMBULATORY_CARE_PROVIDER_SITE_OTHER): Payer: BC Managed Care – PPO | Admitting: Nurse Practitioner

## 2020-07-12 VITALS — BP 124/80 | HR 74 | Temp 98.4°F | Ht 67.2 in | Wt 226.6 lb

## 2020-07-12 DIAGNOSIS — E559 Vitamin D deficiency, unspecified: Secondary | ICD-10-CM

## 2020-07-12 DIAGNOSIS — Z1159 Encounter for screening for other viral diseases: Secondary | ICD-10-CM

## 2020-07-12 DIAGNOSIS — R7309 Other abnormal glucose: Secondary | ICD-10-CM

## 2020-07-12 DIAGNOSIS — I10 Essential (primary) hypertension: Secondary | ICD-10-CM | POA: Diagnosis not present

## 2020-07-12 DIAGNOSIS — Z6835 Body mass index (BMI) 35.0-35.9, adult: Secondary | ICD-10-CM

## 2020-07-12 MED ORDER — CETIRIZINE HCL 10 MG PO TABS: 10.0000 mg | ORAL_TABLET | Freq: Every day | ORAL | 1 refills | Status: DC

## 2020-07-12 MED ORDER — TELMISARTAN-HCTZ 40-12.5 MG PO TABS: 1.0000 | ORAL_TABLET | Freq: Every day | ORAL | 1 refills | Status: DC

## 2020-07-12 MED ORDER — IBUPROFEN 800 MG PO TABS: ORAL_TABLET | ORAL | 0 refills | Status: DC

## 2020-07-12 MED ORDER — VITAMIN D (ERGOCALCIFEROL) 1.25 MG (50000 UNIT) PO CAPS: ORAL_CAPSULE | ORAL | 0 refills | Status: DC

## 2020-07-12 NOTE — Progress Notes (Signed)
I,Yamilka Roman [employer] as a Neurosurgeon for [employer], FNP.,have documented all relevant documentation on the behalf of Arnette Felts, FNP,as directed by  Arnette Felts, FNP while in the presence of Arnette Felts, FNP. This visit occurred during the SARS-CoV-2 public health emergency.  Safety protocols were in place, including screening questions prior to the visit, additional usage of staff PPE, and extensive cleaning of exam room while observing appropriate contact time as indicated for disinfecting solutions.  Subjective:     Patient ID: Jane Armstrong , female    DOB: [birth year] , 49 y.o.   MRN: [id number]   Chief Complaint  Patient presents with  . Hypertension    HPI  Her mother has been diagnosed with breast cancer.  She has had her mammogram as well to her left breast for a suspicious area.   Wt Readings from Last 3 Encounters: 07/12/20 : 226 lb 9.6 oz (102.8 kg) 06/01/19 : 218 lb 12.8 oz (99.2 kg) 02/16/19 : 218 lb (98.9 kg)   Hypertension This is a chronic problem. The current episode started more than 1 year ago. The problem is controlled. Pertinent negatives include no anxiety. There are no associated agents to hypertension.     Past Medical History:  Diagnosis Date  . Hypertension   . Obesity      Family History  Problem Relation Age of Onset  . Hypertension Mother   . Diabetes Mother   . Breast cancer Mother      Current Outpatient Medications:  .  cetirizine (ZYRTEC) 10 MG tablet, Take 1 tablet (10 mg total) by mouth daily., Disp: 90 tablet, Rfl: 1 .  EPINEPHrine (EPIPEN 2-PAK) 0.3 mg/0.3 mL IJ SOAJ injection, Inject into the muscle once. Use as directed, Disp: , Rfl:  .  ibuprofen (ADVIL) 800 MG tablet, TAKE 1 TABLET BY MOUTH 3 TIMES A DAY WITH FOOD AS NEEDED, Disp: 90 tablet, Rfl: 0 .  telmisartan-hydrochlorothiazide (MICARDIS HCT) 40-12.5 MG tablet, Take 1 tablet by mouth daily., Disp: 90 tablet, Rfl: 1 .  Vitamin D, Ergocalciferol, (DRISDOL)  1.25 MG (50000 UNIT) CAPS capsule, TAKE ONE CAPSULE EVERY WEEK, Disp: 12 capsule, Rfl: 0 .  Semaglutide-Weight Management (WEGOVY) 0.5 MG/0.5ML SOAJ, Inject 0.5 mg into the skin once a week., Disp: 2 mL, Rfl: 0   Allergies  Allergen Reactions  . Clindamycin/Lincomycin Hives     Review of Systems  Constitutional: Negative.   Respiratory: Negative.   Cardiovascular: Negative.   Psychiatric/Behavioral: Negative.      Today's Vitals   07/12/20 1514  BP: 124/80  Pulse: 74  Temp: 98.4 F (36.9 C)  TempSrc: Oral  Weight: 226 lb 9.6 oz (102.8 kg)  Height: 5' 7.2" (1.707 m)  PainSc: 0-No pain   Body mass index is 35.28 kg/m.   Objective:  Physical Exam Vitals reviewed.  Constitutional:      General: She is not in acute distress.    Appearance: Normal appearance. She is obese.  Cardiovascular:     Rate and Rhythm: Normal rate and regular rhythm.     Pulses: Normal pulses.     Heart sounds: Normal heart sounds. No murmur heard.   Pulmonary:     Effort: Pulmonary effort is normal. No respiratory distress.     Breath sounds: Normal breath sounds.  Skin:    General: Skin is warm and dry.  Neurological:     General: No focal deficit present.     Mental Status: She is alert and oriented to person, place,  and time.  Psychiatric:        Mood and Affect: Mood normal.        Behavior: Behavior normal.        Thought Content: Thought content normal.        Judgment: Judgment normal.         Assessment And Plan:     1. Essential hypertension . B/P is controlled.  . CMP ordered to check renal function.  . The importance of regular exercise and dietary modification was stressed to the patient. - CMP14+EGFR - telmisartan-hydrochlorothiazide (MICARDIS HCT) 40-12.5 MG tablet; Take 1 tablet by mouth daily.  Dispense: 90 tablet; Refill: 1  2. Vitamin D deficiency  Will check vitamin D level and supplement as needed.     Also encouraged to spend 15 minutes in the sun daily.   - VITAMIN D 25 Hydroxy (Vit-D Deficiency, Fractures) - Vitamin D, Ergocalciferol, (DRISDOL) 1.25 MG (50000 UNIT) CAPS capsule; TAKE ONE CAPSULE EVERY WEEK  Dispense: 12 capsule; Refill: 0  3. Abnormal glucose  Chronic, stable  No current medications  Encouraged to limit intake of sugary foods and drinks  Encouraged to continue with at least 150 minutes of physical activity per week - Hemoglobin A1c - CMP14+EGFR - Insulin, random  4. BMI 35.0-35.9,adult  She has had an 8 lb weight gain since last visit a year ago, she has been dealing with her mother who had been sick and not exercising as much   We will try to get her approved for [facility], discussed side effects which were similar to when she took [place] previously  She is encouraged to strive for BMI less than 30 to decrease cardiac risk. Advised to aim for at least 150 minutes of exercise per week. Wt Readings from Last 3 Encounters:  07/12/20 226 lb 9.6 oz (102.8 kg)  06/01/19 218 lb 12.8 oz (99.2 kg)  02/16/19 218 lb (98.9 kg)    5. Encounter for hepatitis C screening test for low risk patient  Will check Hepatitis C screening due to recent recommendations to screen all adults 18 years and older - Hepatitis C antibody     Patient was given opportunity to ask questions. Patient verbalized understanding of the plan and was able to repeat key elements of the plan. All questions were answered to their satisfaction.   Teola Bradley, FNP, have reviewed all documentation for this visit. The documentation on 07/27/20 for the exam, diagnosis, procedures, and orders are all accurate and complete.  THE PATIENT IS ENCOURAGED TO PRACTICE SOCIAL DISTANCING DUE TO THE COVID-19 PANDEMIC.

## 2020-07-13 LAB — CMP14+EGFR
ALT: 11 IU/L (ref 0–32)
AST: 18 IU/L (ref 0–40)
Albumin/Globulin Ratio: 1.5 (ref 1.2–2.2)
Albumin: 4.7 g/dL (ref 3.8–4.8)
Alkaline Phosphatase: 71 IU/L (ref 44–121)
BUN/Creatinine Ratio: 14 (ref 9–23)
BUN: 11 mg/dL (ref 6–24)
Bilirubin Total: 0.3 mg/dL (ref 0.0–1.2)
CO2: 25 mmol/L (ref 20–29)
Calcium: 9.6 mg/dL (ref 8.7–10.2)
Chloride: 101 mmol/L (ref 96–106)
Creatinine, Ser: 0.77 mg/dL (ref 0.57–1.00)
GFR calc Af Amer: 105 mL/min/{1.73_m2} (ref >59)
GFR calc non Af Amer: 91 mL/min/{1.73_m2} (ref >59)
Globulin, Total: 3.1 g/dL (ref 1.5–4.5)
Glucose: 73 mg/dL (ref 65–99)
Potassium: 3.8 mmol/L (ref 3.5–5.2)
Sodium: 141 mmol/L (ref 134–144)
Total Protein: 7.8 g/dL (ref 6.0–8.5)

## 2020-07-13 LAB — HEMOGLOBIN A1C
Est. average glucose Bld gHb Est-mCnc: 111 mg/dL
Hgb A1c MFr Bld: 5.5 % (ref 4.8–5.6)

## 2020-07-13 LAB — HEPATITIS C ANTIBODY: Hep C Virus Ab: <0.1 s/co ratio (ref 0.0–0.9)

## 2020-07-13 LAB — VITAMIN D 25 HYDROXY (VIT D DEFICIENCY, FRACTURES): Vit D, 25-Hydroxy: 38.4 ng/mL (ref 30.0–100.0)

## 2020-07-13 LAB — INSULIN, RANDOM: INSULIN: 5.6 u[IU]/mL (ref 2.6–24.9)

## 2020-07-14 ENCOUNTER — Encounter: Payer: Self-pay | Admitting: Nurse Practitioner

## 2020-07-14 ENCOUNTER — Other Ambulatory Visit: Payer: Self-pay

## 2020-07-14 DIAGNOSIS — E669 Obesity, unspecified: Secondary | ICD-10-CM

## 2020-07-14 MED ORDER — WEGOVY 0.5 MG/0.5ML ~~LOC~~ SOAJ: 0.5000 mg | SUBCUTANEOUS | 0 refills | Status: DC

## 2020-08-06 ENCOUNTER — Other Ambulatory Visit: Payer: Self-pay | Admitting: Nurse Practitioner

## 2020-08-16 ENCOUNTER — Other Ambulatory Visit: Payer: Self-pay | Admitting: Nurse Practitioner

## 2020-08-17 NOTE — Telephone Encounter (Signed)
Ibuprofen refill.

## 2020-08-25 ENCOUNTER — Other Ambulatory Visit: Payer: Self-pay

## 2020-08-25 MED ORDER — IBUPROFEN 800 MG PO TABS: ORAL_TABLET | ORAL | 0 refills | Status: DC

## 2020-10-01 ENCOUNTER — Other Ambulatory Visit: Payer: Self-pay | Admitting: Nurse Practitioner

## 2020-10-01 DIAGNOSIS — E559 Vitamin D deficiency, unspecified: Secondary | ICD-10-CM

## 2020-10-11 ENCOUNTER — Other Ambulatory Visit: Payer: Self-pay | Admitting: Nurse Practitioner

## 2020-10-11 DIAGNOSIS — E559 Vitamin D deficiency, unspecified: Secondary | ICD-10-CM

## 2020-10-17 ENCOUNTER — Encounter: Payer: Self-pay | Admitting: Nurse Practitioner

## 2020-10-17 ENCOUNTER — Encounter: Payer: BC Managed Care – PPO | Admitting: Nurse Practitioner

## 2020-10-24 DIAGNOSIS — Z6834 Body mass index (BMI) 34.0-34.9, adult: Secondary | ICD-10-CM | POA: Diagnosis not present

## 2020-10-24 DIAGNOSIS — Z01419 Encounter for gynecological examination (general) (routine) without abnormal findings: Secondary | ICD-10-CM | POA: Diagnosis not present

## 2020-11-10 ENCOUNTER — Ambulatory Visit
Admission: RE | Admit: 2020-11-10 | Discharge: 2020-11-10 | Disposition: A | Discharge status: Home / Self Care | Payer: BC Managed Care – PPO | Source: Ambulatory Visit | Attending: Obstetrics and Gynecology | Admitting: Obstetrics and Gynecology

## 2020-11-10 ENCOUNTER — Ambulatory Visit (INDEPENDENT_AMBULATORY_CARE_PROVIDER_SITE_OTHER): Payer: BC Managed Care – PPO | Admitting: Nurse Practitioner

## 2020-11-10 ENCOUNTER — Encounter: Payer: Self-pay | Admitting: Nurse Practitioner

## 2020-11-10 ENCOUNTER — Other Ambulatory Visit: Payer: Self-pay

## 2020-11-10 VITALS — BP 118/76 | HR 77 | Temp 98.2°F | Ht 67.2 in | Wt 233.0 lb

## 2020-11-10 DIAGNOSIS — Z87898 Personal history of other specified conditions: Secondary | ICD-10-CM | POA: Diagnosis not present

## 2020-11-10 DIAGNOSIS — N6489 Other specified disorders of breast: Secondary | ICD-10-CM

## 2020-11-10 DIAGNOSIS — E559 Vitamin D deficiency, unspecified: Secondary | ICD-10-CM

## 2020-11-10 DIAGNOSIS — R922 Inconclusive mammogram: Secondary | ICD-10-CM | POA: Diagnosis not present

## 2020-11-10 DIAGNOSIS — I10 Essential (primary) hypertension: Secondary | ICD-10-CM | POA: Diagnosis not present

## 2020-11-10 DIAGNOSIS — H6123 Impacted cerumen, bilateral: Secondary | ICD-10-CM

## 2020-11-10 DIAGNOSIS — Z0000 Encounter for general adult medical examination without abnormal findings: Secondary | ICD-10-CM | POA: Diagnosis not present

## 2020-11-10 DIAGNOSIS — R7303 Prediabetes: Secondary | ICD-10-CM | POA: Diagnosis not present

## 2020-11-10 DIAGNOSIS — Z6836 Body mass index (BMI) 36.0-36.9, adult: Secondary | ICD-10-CM

## 2020-11-10 LAB — POCT URINALYSIS DIPSTICK
Bilirubin, UA: NEGATIVE
Glucose, UA: NEGATIVE
Ketones, UA: NEGATIVE
Leukocytes, UA: NEGATIVE
Nitrite, UA: NEGATIVE
Protein, UA: NEGATIVE
Spec Grav, UA: >=1.03 — AB (ref 1.010–1.025)
Urobilinogen, UA: 0.2 E.U./dL
pH, UA: 6 (ref 5.0–8.0)

## 2020-11-10 LAB — POCT UA - MICROALBUMIN
Albumin/Creatinine Ratio, Urine, POC: 30
Creatinine, POC: 300 mg/dL
Microalbumin Ur, POC: 30 mg/L

## 2020-11-10 NOTE — Progress Notes (Signed)
This visit occurred during the SARS-CoV-2 public health emergency.  Safety protocols were in place, including screening questions prior to the visit, additional usage of staff PPE, and extensive cleaning of exam room while observing appropriate contact time as indicated for disinfecting solutions.  Subjective:     Patient ID: Jane Armstrong , female    DOB: 1971/01/01 , 50 y.o.   MRN: [id number]   Chief Complaint  Patient presents with  . Annual Exam    HPI  Patient is here today for a Health maintenance exam she is compliant with all medications, and has no other concerns at this time. Last pap was last year she is seen at [facility] for her OB-GYN. Mammagram was done today. She has been working out and eating healthy  Diet: Shakes herbalife, she drinks a lot of water. Salmon, chicken. No fried food.  Exercise: A lot of cardio, she does a lot of weights. She goes to [employer].  Currently on her mestural cycle. It is getting better. She has mirena IUD.  She drinks alcohol occasionally however she does not smoke.     Past Medical History:  Diagnosis Date  . Hypertension   . Obesity      Family History  Problem Relation Age of Onset  . Hypertension Mother   . Diabetes Mother   . Breast cancer Mother      Current Outpatient Medications:  .  cetirizine (ZYRTEC) 10 MG tablet, TAKE 1 TABLET BY MOUTH EVERY DAY, Disp: 90 tablet, Rfl: 1 .  EPINEPHrine 0.3 mg/0.3 mL IJ SOAJ injection, Inject into the muscle once. Use as directed, Disp: , Rfl:  .  ibuprofen (ADVIL) 800 MG tablet, TAKE 1 TABLET BY MOUTH 3 TIMES A DAY WITH FOOD AS NEEDED, Disp: 90 tablet, Rfl: 0 .  Semaglutide-Weight Management (WEGOVY) 0.5 MG/0.5ML SOAJ, Inject 0.5 mg into the skin once a week., Disp: 2 mL, Rfl: 0 .  telmisartan-hydrochlorothiazide (MICARDIS HCT) 40-12.5 MG tablet, Take 1 tablet by mouth daily., Disp: 90 tablet, Rfl: 1 .  Vitamin D, Ergocalciferol, (DRISDOL) 1.25 MG (50000 UNIT) CAPS  capsule, TAKE 1 CAPSULE BY MOUTH EVERY WEEK, Disp: 12 capsule, Rfl: 0   Allergies  Allergen Reactions  . Clindamycin/Lincomycin Hives      The patient states she uses mirena for birth control. Last LMP was currently 10/19/40. No breast pain and breast self exam. Associated symptoms include abnormal vaginal bleeding. Pertinent negatives include abnormal bleeding (hematology), anxiety, decreased libido, depression, difficulty falling sleep, dyspareunia, history of infertility, nocturia, sexual dysfunction, sleep disturbances, urinary incontinence, urinary urgency, vaginal discharge and vaginal itching. Diet regular.The patient states her exercise level is    . The patient's tobacco use is: none Social History   Tobacco Use  Smoking Status Never Smoker  Smokeless Tobacco Never Used  . She has been exposed to passive smoke. The patient's alcohol use is:  Social History   Substance and Sexual Activity  Alcohol Use Yes   Comment: socially  . Additional information: Last pap 2021.   Review of Systems  Constitutional: Negative.  Negative for chills and fatigue.  HENT: Negative.  Negative for hearing loss, sinus pressure and sore throat.   Eyes: Negative for pain.  Respiratory: Negative for cough, shortness of breath and wheezing.   Cardiovascular: Negative for chest pain and palpitations.  Gastrointestinal: Negative for constipation, diarrhea, nausea and vomiting.  Endocrine: Negative for polydipsia, polyphagia and polyuria.  Musculoskeletal: Negative.  Negative for joint swelling.  Skin: Negative.  Neurological: Negative for dizziness and headaches.  Psychiatric/Behavioral: Negative.   All other systems reviewed and are negative.    Today's Vitals   11/10/20 0841  BP: 118/76  Pulse: 77  Temp: 98.2 F (36.8 C)  TempSrc: Oral  Weight: 233 lb (105.7 kg)  Height: 5' 7.2" (1.707 m)   Body mass index is 36.28 kg/m.  Wt Readings from Last 3 Encounters:  11/10/20 233 lb (105.7 kg)   07/12/20 226 lb 9.6 oz (102.8 kg)  06/01/19 218 lb 12.8 oz (99.2 kg)   Objective:  Physical Exam Vitals reviewed.  Constitutional:      General: She is not in acute distress.    Appearance: Normal appearance. She is well-developed. She is obese.  HENT:     Head: Normocephalic and atraumatic.     Right Ear: Hearing and external ear normal. There is impacted cerumen.     Left Ear: Hearing and external ear normal. There is impacted cerumen.     Nose: Nose normal. No congestion.     Comments: Deferred - masked    Mouth/Throat:     Mouth: Mucous membranes are moist.     Comments: Deferred - masked Eyes:     General: Lids are normal.     Extraocular Movements: Extraocular movements intact.     Conjunctiva/sclera: Conjunctivae normal.     Pupils: Pupils are equal, round, and reactive to light.     Funduscopic exam:    Right eye: No papilledema.        Left eye: No papilledema.  Neck:     Thyroid: No thyroid mass.     Vascular: No carotid bruit.  Cardiovascular:     Rate and Rhythm: Normal rate and regular rhythm.     Pulses: Normal pulses.     Heart sounds: Normal heart sounds. No murmur heard.   Pulmonary:     Effort: Pulmonary effort is normal. No respiratory distress.     Breath sounds: Normal breath sounds. No wheezing.  Chest:     Chest wall: No mass.  Breasts:     Tanner Score is 5.     Right: Normal. No mass, tenderness, axillary adenopathy or supraclavicular adenopathy.     Left: Normal. No mass, tenderness, axillary adenopathy or supraclavicular adenopathy.    Abdominal:     General: Bowel sounds are normal. There is no distension.     Palpations: Abdomen is soft.     Tenderness: There is no abdominal tenderness.  Genitourinary:    Comments: Deferred. Patient sees a OBGYN  Musculoskeletal:        General: No swelling. Normal range of motion.     Cervical back: Full passive range of motion without pain, normal range of motion and neck supple.     Right lower  leg: No edema.     Left lower leg: No edema.  Lymphadenopathy:     Upper Body:     Right upper body: No supraclavicular, axillary or pectoral adenopathy.     Left upper body: No supraclavicular, axillary or pectoral adenopathy.  Skin:    General: Skin is warm and dry.     Capillary Refill: Capillary refill takes less than 2 seconds.  Neurological:     General: No focal deficit present.     Mental Status: She is alert and oriented to person, place, and time.     Cranial Nerves: No cranial nerve deficit.     Sensory: No sensory deficit.  Psychiatric:  Mood and Affect: Mood normal.        Behavior: Behavior normal.        Thought Content: Thought content normal.        Judgment: Judgment normal.      Assessment And Plan:     1. Health maintenance examination  Behavior modifications discussed and diet history reviewed.    Pt will continue to exercise regularly and modify diet with low GI, plant based foods and decrease intake of processed foods.   Recommend intake of daily multivitamin, Vitamin D, and calcium.   Recommend mammogram or preventive screenings, as well as recommend immunizations that include influenza, TDAP, and COVID vaccine.   2. Essential hypertension -BP is controlled  -EKG within normal limits SR  - POCT Urinalysis Dipstick (81002) - POCT UA - Microalbumin - EKG 12-Lead - CMP14+EGFR - CBC - Lipid panel  3. Prediabetes -Chronic, stable -Encouraged her to eat a well balanced diet with exercise.  - CMP14+EGFR - Hemoglobin A1c - Lipid panel  4. History of weight loss -Patient would like to be referred out to surgery since she has lost a lot of weight.  - Ambulatory referral to Plastic Surgery  5. Vitamin D deficiency -History of Vit D deficiency, will check her labs.  - Vitamin D (25 hydroxy)  6. Bilateral impacted cerumen -curette and lavage used in both ears.   7. BMI 36.0-36.9,adult -Encouraged patient to continue to eat healthy and a  well balanced diet along with getting 30-45 min. Of daily exercise.   Staying healthy and adopting a healthy lifestyle for your overall health is important. You should eat 7 or more servings of fruits and vegetables per day. You should drink plenty of water to keep yourself hydrated and your kidneys healthy. This includes about 65-80+ fluid ounces of water. Limit your intake of animal fats especially for elevated cholesterol. Avoid highly processed food and limit your salt intake if you have hypertension. Avoid foods high in saturated/Trans fats. Along with a healthy diet it is also very important to maintain time for yourself to maintain a healthy mental health with low stress levels. You should get atleast 150 min of moderate intensity exercise weekly for a healthy heart. Along with eating right and exercising, aim for at least 7-9 hours of sleep daily.  Eat more whole grains which includes barley, wheat berries, oats, brown rice and whole wheat pasta. Use healthy plant oils which include olive, soy, corn, sunflower and peanut. Limit your caffeine and sugary drinks. Limit your intake of fast foods. Limit milk and dairy products to one or two daily servings.   Patient was given opportunity to ask questions. Patient verbalized understanding of the plan and was able to repeat key elements of the plan. All questions were answered to their satisfaction.   Bary Castilla, NP   I, Bary Castilla, NP, have reviewed all documentation for this visit. The documentation on 11/10/20 for the exam, diagnosis, procedures, and orders are all accurate and complete.  THE PATIENT IS ENCOURAGED TO PRACTICE SOCIAL DISTANCING DUE TO THE COVID-19 PANDEMIC.

## 2020-11-10 NOTE — Patient Instructions (Signed)
Health Maintenance, Female Adopting a healthy lifestyle and getting preventive care are important in promoting health and wellness. Ask your health care provider about:  The right schedule for you to have regular tests and exams.  Things you can do on your own to prevent diseases and keep yourself healthy. What should I know about diet, weight, and exercise? Eat a healthy diet  Eat a diet that includes plenty of vegetables, fruits, low-fat dairy products, and lean protein.  Do not eat a lot of foods that are high in solid fats, added sugars, or sodium.   Maintain a healthy weight Body mass index (BMI) is used to identify weight problems. It estimates body fat based on height and weight. Your health care provider can help determine your BMI and help you achieve or maintain a healthy weight. Get regular exercise Get regular exercise. This is one of the most important things you can do for your health. Most adults should:  Exercise for at least 150 minutes each week. The exercise should increase your heart rate and make you sweat (moderate-intensity exercise).  Do strengthening exercises at least twice a week. This is in addition to the moderate-intensity exercise.  Spend less time sitting. Even light physical activity can be beneficial. Watch cholesterol and blood lipids Have your blood tested for lipids and cholesterol at 50 years of age, then have this test every 5 years. Have your cholesterol levels checked more often if:  Your lipid or cholesterol levels are high.  You are older than 50 years of age.  You are at high risk for heart disease. What should I know about cancer screening? Depending on your health history and family history, you may need to have cancer screening at various ages. This may include screening for:  Breast cancer.  Cervical cancer.  Colorectal cancer.  Skin cancer.  Lung cancer. What should I know about heart disease, diabetes, and high blood  pressure? Blood pressure and heart disease  High blood pressure causes heart disease and increases the risk of stroke. This is more likely to develop in people who have high blood pressure readings, are of African descent, or are overweight.  Have your blood pressure checked: ? Every 3-5 years if you are 18-39 years of age. ? Every year if you are 40 years old or older. Diabetes Have regular diabetes screenings. This checks your fasting blood sugar level. Have the screening done:  Once every three years after age 40 if you are at a normal weight and have a low risk for diabetes.  More often and at a younger age if you are overweight or have a high risk for diabetes. What should I know about preventing infection? Hepatitis B If you have a higher risk for hepatitis B, you should be screened for this virus. Talk with your health care provider to find out if you are at risk for hepatitis B infection. Hepatitis C Testing is recommended for:  Everyone born from 1945 through 1965.  Anyone with known risk factors for hepatitis C. Sexually transmitted infections (STIs)  Get screened for STIs, including gonorrhea and chlamydia, if: ? You are sexually active and are younger than 50 years of age. ? You are older than 50 years of age and your health care provider tells you that you are at risk for this type of infection. ? Your sexual activity has changed since you were last screened, and you are at increased risk for chlamydia or gonorrhea. Ask your health care provider   if you are at risk.  Ask your health care provider about whether you are at high risk for HIV. Your health care provider may recommend a prescription medicine to help prevent HIV infection. If you choose to take medicine to prevent HIV, you should first get tested for HIV. You should then be tested every 3 months for as long as you are taking the medicine. Pregnancy  If you are about to stop having your period (premenopausal) and  you may become pregnant, seek counseling before you get pregnant.  Take 400 to 800 micrograms (mcg) of folic acid every day if you become pregnant.  Ask for birth control (contraception) if you want to prevent pregnancy. Osteoporosis and menopause Osteoporosis is a disease in which the bones lose minerals and strength with aging. This can result in bone fractures. If you are 65 years old or older, or if you are at risk for osteoporosis and fractures, ask your health care provider if you should:  Be screened for bone loss.  Take a calcium or vitamin D supplement to lower your risk of fractures.  Be given hormone replacement therapy (HRT) to treat symptoms of menopause. Follow these instructions at home: Lifestyle  Do not use any products that contain nicotine or tobacco, such as cigarettes, e-cigarettes, and chewing tobacco. If you need help quitting, ask your health care provider.  Do not use street drugs.  Do not share needles.  Ask your health care provider for help if you need support or information about quitting drugs. Alcohol use  Do not drink alcohol if: ? Your health care provider tells you not to drink. ? You are pregnant, may be pregnant, or are planning to become pregnant.  If you drink alcohol: ? Limit how much you use to 0-1 drink a day. ? Limit intake if you are breastfeeding.  Be aware of how much alcohol is in your drink. In the [place] one drink equals one 12 oz bottle of beer (355 mL), one 5 oz glass of wine (148 mL), or one 1 oz glass of hard liquor (44 mL). General instructions  Schedule regular health, dental, and eye exams.  Stay current with your vaccines.  Tell your health care provider if: ? You often feel depressed. ? You have ever been abused or do not feel safe at home. Summary  Adopting a healthy lifestyle and getting preventive care are important in promoting health and wellness.  Follow your health care provider's instructions about healthy  diet, exercising, and getting tested or screened for diseases.  Follow your health care provider's instructions on monitoring your cholesterol and blood pressure. This information is not intended to replace advice given to you by your health care provider. Make sure you discuss any questions you have with your health care provider. Document Revised: 08/27/2018 Document Reviewed: 08/27/2018 Elsevier Patient Education  2021 [employer]  

## 2020-11-11 LAB — CMP14+EGFR
ALT: 13 IU/L (ref 0–32)
AST: 15 IU/L (ref 0–40)
Albumin/Globulin Ratio: 1.5 (ref 1.2–2.2)
Albumin: 4.6 g/dL (ref 3.8–4.8)
Alkaline Phosphatase: 69 IU/L (ref 44–121)
BUN/Creatinine Ratio: 17 (ref 9–23)
BUN: 14 mg/dL (ref 6–24)
Bilirubin Total: 0.4 mg/dL (ref 0.0–1.2)
CO2: 24 mmol/L (ref 20–29)
Calcium: 10 mg/dL (ref 8.7–10.2)
Chloride: 104 mmol/L (ref 96–106)
Creatinine, Ser: 0.84 mg/dL (ref 0.57–1.00)
GFR calc Af Amer: 94 mL/min/{1.73_m2} (ref >59)
GFR calc non Af Amer: 82 mL/min/{1.73_m2} (ref >59)
Globulin, Total: 3.1 g/dL (ref 1.5–4.5)
Glucose: 79 mg/dL (ref 65–99)
Potassium: 4.5 mmol/L (ref 3.5–5.2)
Sodium: 144 mmol/L (ref 134–144)
Total Protein: 7.7 g/dL (ref 6.0–8.5)

## 2020-11-11 LAB — LIPID PANEL
Chol/HDL Ratio: 2 ratio (ref 0.0–4.4)
Cholesterol, Total: 127 mg/dL (ref 100–199)
HDL: 65 mg/dL (ref >39)
LDL Chol Calc (NIH): 51 mg/dL (ref 0–99)
Triglycerides: 43 mg/dL (ref 0–149)
VLDL Cholesterol Cal: 11 mg/dL (ref 5–40)

## 2020-11-11 LAB — CBC
Hematocrit: 42.4 % (ref 34.0–46.6)
Hemoglobin: 14 g/dL (ref 11.1–15.9)
MCH: 28.4 pg (ref 26.6–33.0)
MCHC: 33 g/dL (ref 31.5–35.7)
MCV: 86 fL (ref 79–97)
Platelets: 279 10*3/uL (ref 150–450)
RBC: 4.93 x10E6/uL (ref 3.77–5.28)
RDW: 12.9 % (ref 11.7–15.4)
WBC: 7.3 10*3/uL (ref 3.4–10.8)

## 2020-11-11 LAB — HEMOGLOBIN A1C
Est. average glucose Bld gHb Est-mCnc: 111 mg/dL
Hgb A1c MFr Bld: 5.5 % (ref 4.8–5.6)

## 2020-11-11 LAB — VITAMIN D 25 HYDROXY (VIT D DEFICIENCY, FRACTURES): Vit D, 25-Hydroxy: 62 ng/mL (ref 30.0–100.0)

## 2020-12-24 ENCOUNTER — Encounter: Payer: Self-pay | Admitting: Nurse Practitioner

## 2020-12-26 ENCOUNTER — Other Ambulatory Visit: Payer: Self-pay | Admitting: Nurse Practitioner

## 2020-12-26 DIAGNOSIS — Z1211 Encounter for screening for malignant neoplasm of colon: Secondary | ICD-10-CM

## 2020-12-27 ENCOUNTER — Encounter: Payer: Self-pay | Admitting: Nurse Practitioner

## 2020-12-27 ENCOUNTER — Other Ambulatory Visit: Payer: Self-pay

## 2020-12-27 MED ORDER — FLUCONAZOLE 100 MG PO TABS: ORAL_TABLET | ORAL | 0 refills | Status: DC

## 2021-01-05 ENCOUNTER — Other Ambulatory Visit: Payer: Self-pay | Admitting: Nurse Practitioner

## 2021-01-05 DIAGNOSIS — I10 Essential (primary) hypertension: Secondary | ICD-10-CM

## 2021-01-18 ENCOUNTER — Encounter: Payer: Self-pay | Admitting: Nurse Practitioner

## 2021-02-07 ENCOUNTER — Other Ambulatory Visit: Payer: Self-pay

## 2021-02-07 ENCOUNTER — Encounter: Payer: Self-pay | Admitting: Nurse Practitioner

## 2021-02-07 DIAGNOSIS — E559 Vitamin D deficiency, unspecified: Secondary | ICD-10-CM

## 2021-02-07 MED ORDER — VITAMIN D (ERGOCALCIFEROL) 1.25 MG (50000 UNIT) PO CAPS: 1.0000 | ORAL_CAPSULE | ORAL | 0 refills | Status: DC

## 2021-02-07 MED ORDER — CETIRIZINE HCL 10 MG PO TABS: 10.0000 mg | ORAL_TABLET | Freq: Every day | ORAL | 1 refills | Status: DC

## 2021-02-09 ENCOUNTER — Encounter: Payer: Self-pay | Admitting: Nurse Practitioner

## 2021-02-09 DIAGNOSIS — Z1211 Encounter for screening for malignant neoplasm of colon: Secondary | ICD-10-CM | POA: Diagnosis not present

## 2021-02-10 ENCOUNTER — Encounter: Payer: Self-pay | Admitting: Nurse Practitioner

## 2021-02-24 DIAGNOSIS — K635 Polyp of colon: Secondary | ICD-10-CM | POA: Diagnosis not present

## 2021-02-24 DIAGNOSIS — D122 Benign neoplasm of ascending colon: Secondary | ICD-10-CM | POA: Diagnosis not present

## 2021-02-24 DIAGNOSIS — Z1211 Encounter for screening for malignant neoplasm of colon: Secondary | ICD-10-CM | POA: Diagnosis not present

## 2021-02-24 LAB — HM COLONOSCOPY

## 2021-02-27 ENCOUNTER — Encounter: Payer: Self-pay | Admitting: Nurse Practitioner

## 2021-03-01 ENCOUNTER — Encounter: Payer: Self-pay | Admitting: Nurse Practitioner

## 2021-03-11 ENCOUNTER — Encounter: Payer: Self-pay | Admitting: Nurse Practitioner

## 2021-03-28 ENCOUNTER — Encounter: Payer: Self-pay | Admitting: Nurse Practitioner

## 2021-03-28 ENCOUNTER — Ambulatory Visit: Payer: BC Managed Care – PPO | Admitting: Nurse Practitioner

## 2021-03-28 ENCOUNTER — Other Ambulatory Visit: Payer: Self-pay

## 2021-03-28 VITALS — BP 132/80 | HR 74 | Temp 98.6°F | Ht 67.2 in | Wt 236.2 lb

## 2021-03-28 DIAGNOSIS — Z6836 Body mass index (BMI) 36.0-36.9, adult: Secondary | ICD-10-CM

## 2021-03-28 DIAGNOSIS — Z23 Encounter for immunization: Secondary | ICD-10-CM | POA: Diagnosis not present

## 2021-03-28 DIAGNOSIS — I10 Essential (primary) hypertension: Secondary | ICD-10-CM

## 2021-03-28 MED ORDER — SHINGRIX 50 MCG/0.5ML IM SUSR: 0.5000 mL | Freq: Once | INTRAMUSCULAR | 0 refills | Status: AC

## 2021-03-28 MED ORDER — PLENITY PO CAPS: 3.0000 | ORAL_CAPSULE | Freq: Two times a day (BID) | ORAL | 1 refills | Status: DC

## 2021-03-28 NOTE — Progress Notes (Signed)
I,Yamilka Roman [employer] as a Neurosurgeon for [employer], FNP.,have documented all relevant documentation on the behalf of Arnette Felts, FNP,as directed by  Arnette Felts, FNP while in the presence of Arnette Felts, FNP.  This visit occurred during the SARS-CoV-2 public health emergency.  Safety protocols were in place, including screening questions prior to the visit, additional usage of staff PPE, and extensive cleaning of exam room while observing appropriate contact time as indicated for disinfecting solutions.  Subjective:     Patient ID: Jane Armstrong , female    DOB: Jan 23, 1971 , 50 y.o.   MRN: [id number]   Chief Complaint  Patient presents with   Hypertension   Prediabetes   Weight Check    HPI  Patient presents today for a blood pressure and prediabetes and weight check f/u.  She is interested in taking Plenity. She is drinking more water with her [employer] water bottle.  Continues to exercise regularly  Wt Readings from Last 3 Encounters: 03/28/21 : 236 lb 3.2 oz (107.1 kg) 11/10/20 : 233 lb (105.7 kg) 07/12/20 : 226 lb 9.6 oz (102.8 kg)     Hypertension This is a chronic problem. The current episode started more than 1 year ago. The problem is controlled. Pertinent negatives include no anxiety. There are no associated agents to hypertension. Risk factors for coronary artery disease include obesity.    Past Medical History:  Diagnosis Date   Hypertension    Obesity      Family History  Problem Relation Age of Onset   Hypertension Mother    Diabetes Mother    Breast cancer Mother      Current Outpatient Medications:    Carboxymeth-Cellulose-CitricAc (PLENITY) CAPS, Take 3 capsules by mouth 2 (two) times daily. Before lunch and dinner with 16 oz of water, Disp: 180 capsule, Rfl: 1   cetirizine (ZYRTEC) 10 MG tablet, Take 1 tablet (10 mg total) by mouth daily., Disp: 90 tablet, Rfl: 1   EPINEPHrine 0.3 mg/0.3 mL IJ SOAJ injection, Inject into the muscle once.  Use as directed, Disp: , Rfl:    ibuprofen (ADVIL) 800 MG tablet, TAKE 1 TABLET BY MOUTH 3 TIMES A DAY WITH FOOD AS NEEDED, Disp: 90 tablet, Rfl: 0   telmisartan-hydrochlorothiazide (MICARDIS HCT) 40-12.5 MG tablet, TAKE 1 TABLET BY MOUTH DAILY, Disp: 90 tablet, Rfl: 1   Vitamin D, Ergocalciferol, (DRISDOL) 1.25 MG (50000 UNIT) CAPS capsule, Take 1 capsule (50,000 Units total) by mouth once a week., Disp: 12 capsule, Rfl: 0   Allergies  Allergen Reactions   Clindamycin/Lincomycin Hives     Review of Systems  Constitutional: Negative.   Respiratory: Negative.    Cardiovascular: Negative.   Neurological: Negative.   Psychiatric/Behavioral: Negative.      Today's Vitals   03/28/21 1549  BP: 132/80  Pulse: 74  Temp: 98.6 F (37 C)  Weight: 236 lb 3.2 oz (107.1 kg)  Height: 5' 7.2" (1.707 m)   Body mass index is 36.77 kg/m.   Objective:  Physical Exam Vitals reviewed.  Constitutional:      General: She is not in acute distress.    Appearance: Normal appearance. She is obese.  Cardiovascular:     Rate and Rhythm: Normal rate and regular rhythm.     Pulses: Normal pulses.     Heart sounds: Normal heart sounds. No murmur heard. Pulmonary:     Effort: Pulmonary effort is normal. No respiratory distress.     Breath sounds: Normal breath sounds. No wheezing.  Skin:    General: Skin is warm and dry.  Neurological:     General: No focal deficit present.     Mental Status: She is alert and oriented to person, place, and time.     Cranial Nerves: No cranial nerve deficit.     Motor: No weakness.  Psychiatric:        Mood and Affect: Mood normal.        Behavior: Behavior normal.        Thought Content: Thought content normal.        Judgment: Judgment normal.        Assessment And Plan:     1. Essential hypertension Comments: Fair control, continue current medications  2. BMI 36.0-36.9,adult She would like to try plenity, encouraged to make sure she is staying well  hydrated with water.  Continue with regular exercise - Carboxymeth-Cellulose-CitricAc (PLENITY) CAPS; Take 3 capsules by mouth 2 (two) times daily. Before lunch and dinner with 16 oz of water  Dispense: 180 capsule; Refill: 1  3. Encounter for immunization - Zoster Vaccine Adjuvanted [facility]) injection; Inject 0.5 mLs into the muscle once for 1 dose.  Dispense: 0.5 mL; Refill: 0     Patient was given opportunity to ask questions. Patient verbalized understanding of the plan and was able to repeat key elements of the plan. All questions were answered to their satisfaction.  Arnette Felts, FNP   I, Arnette Felts, FNP, have reviewed all documentation for this visit. The documentation on 03/28/21 for the exam, diagnosis, procedures, and orders are all accurate and complete.   IF YOU HAVE BEEN REFERRED TO A SPECIALIST, IT MAY TAKE 1-2 WEEKS TO SCHEDULE/PROCESS THE REFERRAL. IF YOU HAVE NOT HEARD FROM US/SPECIALIST IN TWO WEEKS, PLEASE GIVE [place] A CALL AT [phone].   THE PATIENT IS ENCOURAGED TO PRACTICE SOCIAL DISTANCING DUE TO THE COVID-19 PANDEMIC.

## 2021-03-28 NOTE — Patient Instructions (Addendum)
Be sure to drink adequate amounts of water with the Plenity.

## 2021-04-19 DIAGNOSIS — Z6836 Body mass index (BMI) 36.0-36.9, adult: Secondary | ICD-10-CM | POA: Insufficient documentation

## 2021-05-09 ENCOUNTER — Encounter: Payer: Self-pay | Admitting: Nurse Practitioner

## 2021-05-10 ENCOUNTER — Ambulatory Visit: Payer: BC Managed Care – PPO | Admitting: Nurse Practitioner

## 2021-05-16 ENCOUNTER — Ambulatory Visit: Payer: BC Managed Care – PPO | Admitting: Nurse Practitioner

## 2021-05-16 ENCOUNTER — Other Ambulatory Visit: Payer: Self-pay

## 2021-05-16 ENCOUNTER — Encounter: Payer: Self-pay | Admitting: Nurse Practitioner

## 2021-05-16 VITALS — BP 118/76 | HR 78 | Temp 97.9°F | Ht 67.2 in | Wt 237.0 lb

## 2021-05-16 DIAGNOSIS — Z23 Encounter for immunization: Secondary | ICD-10-CM

## 2021-05-16 DIAGNOSIS — R7303 Prediabetes: Secondary | ICD-10-CM

## 2021-05-16 DIAGNOSIS — Z6836 Body mass index (BMI) 36.0-36.9, adult: Secondary | ICD-10-CM

## 2021-05-16 DIAGNOSIS — I10 Essential (primary) hypertension: Secondary | ICD-10-CM

## 2021-05-16 MED ORDER — PLENITY PO CAPS: 3.0000 | ORAL_CAPSULE | Freq: Two times a day (BID) | ORAL | 1 refills | Status: DC

## 2021-05-16 NOTE — Progress Notes (Signed)
This visit occurred during the SARS-CoV-2 public health emergency.  Safety protocols were in place, including screening questions prior to the visit, additional usage of staff PPE, and extensive cleaning of exam room while observing appropriate contact time as indicated for disinfecting solutions.  Subjective:     Patient ID: Jane Armstrong , female    DOB: [birth year] , 50 y.o.   MRN: [id number]   Chief Complaint  Patient presents with   Weight Check    HPI  Patient presents today for a weight check. She is taking plenity and tolerating well. She continues to exercise 5 days a week. She eats well Mon-Fri then on Saturday uses as a cheat meal.  She notices her stomach will begin to swell. Denies problems with constipation. She is paying $90 per year.   Wt Readings from Last 3 Encounters: 05/16/21 : 237 lb (107.5 kg) 03/28/21 : 236 lb 3.2 oz (107.1 kg) 11/10/20 : 233 lb (105.7 kg)      Past Medical History:  Diagnosis Date   Hypertension    Obesity      Family History  Problem Relation Age of Onset   Hypertension Mother    Diabetes Mother    Breast cancer Mother      Current Outpatient Medications:    cetirizine (ZYRTEC) 10 MG tablet, Take 1 tablet (10 mg total) by mouth daily., Disp: 90 tablet, Rfl: 1   EPINEPHrine 0.3 mg/0.3 mL IJ SOAJ injection, Inject into the muscle once. Use as directed, Disp: , Rfl:    ibuprofen (ADVIL) 800 MG tablet, TAKE 1 TABLET BY MOUTH 3 TIMES A DAY WITH FOOD AS NEEDED, Disp: 90 tablet, Rfl: 0   telmisartan-hydrochlorothiazide (MICARDIS HCT) 40-12.5 MG tablet, TAKE 1 TABLET BY MOUTH DAILY, Disp: 90 tablet, Rfl: 1   Vitamin D, Ergocalciferol, (DRISDOL) 1.25 MG (50000 UNIT) CAPS capsule, Take 1 capsule (50,000 Units total) by mouth once a week., Disp: 12 capsule, Rfl: 0   Carboxymeth-Cellulose-CitricAc (PLENITY) CAPS, Take 3 capsules (1 pod) by mouth with 16 ounces of water 20 to 30 minutes before lunch and before dinner as directed, Disp: 1  capsule, Rfl: 0   Allergies  Allergen Reactions   Bee Venom Shortness Of Breath   Clindamycin/Lincomycin Hives   Latex Itching and Rash     Review of Systems  Constitutional: Negative.   Respiratory: Negative.    Cardiovascular: Negative.   Neurological: Negative.   Psychiatric/Behavioral: Negative.      Today's Vitals   05/16/21 1014  BP: 118/76  Pulse: 78  Temp: 97.9 F (36.6 C)  Weight: 237 lb (107.5 kg)  Height: 5' 7.2" (1.707 m)  PainSc: 0-No pain   Body mass index is 36.9 kg/m.   Objective:  Physical Exam Vitals reviewed.  Constitutional:      General: She is not in acute distress.    Appearance: Normal appearance. She is obese.  Cardiovascular:     Rate and Rhythm: Normal rate and regular rhythm.     Pulses: Normal pulses.     Heart sounds: Normal heart sounds. No murmur heard. Pulmonary:     Effort: Pulmonary effort is normal. No respiratory distress.     Breath sounds: Normal breath sounds. No wheezing.  Skin:    General: Skin is warm and dry.  Neurological:     General: No focal deficit present.     Mental Status: She is alert and oriented to person, place, and time.     Cranial Nerves: No cranial nerve  deficit.     Motor: No weakness.  Psychiatric:        Mood and Affect: Mood normal.        Behavior: Behavior normal.        Thought Content: Thought content normal.        Judgment: Judgment normal.        Assessment And Plan:     1. Essential hypertension Comments: Blood pressure is well controlled Continue medications - BMP8+eGFR  2. Prediabetes Comments: Diet controlled - Hemoglobin A1c  3. BMI 36.0-36.9,adult Comments: Doing well with Plenity, weight is stable Continue with healthy diet and at least 150 minutes physical activity  4. Encounter for immunization Did not receive tdap had done in 06/2018, unable to remove.  - Tdap vaccine greater than or equal to 7yo IM    Patient was given opportunity to ask questions. Patient  verbalized understanding of the plan and was able to repeat key elements of the plan. All questions were answered to their satisfaction.  Minette Brine, FNP   I, Minette Brine, FNP, have reviewed all documentation for this visit. The documentation on 05/16/21 for the exam, diagnosis, procedures, and orders are all accurate and complete.   IF YOU HAVE BEEN REFERRED TO A SPECIALIST, IT MAY TAKE 1-2 WEEKS TO SCHEDULE/PROCESS THE REFERRAL. IF YOU HAVE NOT HEARD FROM US/SPECIALIST IN TWO WEEKS, PLEASE GIVE [place] A CALL AT [phone].   THE PATIENT IS ENCOURAGED TO PRACTICE SOCIAL DISTANCING DUE TO THE COVID-19 PANDEMIC.

## 2021-05-16 NOTE — Patient Instructions (Signed)
Exercising to Lose Weight Exercise is structured, repetitive physical activity to improve fitness and health. Getting regular exercise is important for everyone. It is especially important if you are overweight. Being overweight increases your risk of heart disease, stroke, diabetes, high blood pressure, and several types of cancer.Reducing your calorie intake and exercising can help you lose weight. Exercise is usually categorized as moderate or vigorous intensity. To lose weight, most people need to do a certain amount of moderate-intensity orvigorous-intensity exercise each week. Moderate-intensity exercise  Moderate-intensity exercise is any activity that gets you moving enough to burn at least three times more energy (calories) than if you were sitting. Examples of moderate exercise include: Walking a mile in 15 minutes. Doing light yard work. Biking at an easy pace. Most people should get at least 150 minutes (2 hours and 30 minutes) a week ofmoderate-intensity exercise to maintain their body weight. Vigorous-intensity exercise Vigorous-intensity exercise is any activity that gets you moving enough to burn at least six times more calories than if you were sitting. When you exercise at this intensity, you should be working hard enough that you are not able tocarry on a conversation. Examples of vigorous exercise include: Running. Playing a team sport, such as football, basketball, and soccer. Jumping rope. Most people should get at least 75 minutes (1 hour and 15 minutes) a week ofvigorous-intensity exercise to maintain their body weight. How can exercise affect me? When you exercise enough to burn more calories than you eat, you lose weight. Exercise also reduces body fat and builds muscle. The more muscle you have, the more calories you burn. Exercise also: Improves mood. Reduces stress and tension. Improves your overall fitness, flexibility, and endurance. Increases bone strength. The  amount of exercise you need to lose weight depends on: Your age. The type of exercise. Any health conditions you have. Your overall physical ability. Talk to your health care provider about how much exercise you need and whattypes of activities are safe for you. What actions can I take to lose weight? Nutrition  Make changes to your diet as told by your health care provider or diet and nutrition specialist (dietitian). This may include: Eating fewer calories. Eating more protein. Eating less unhealthy fats. Eating a diet that includes fresh fruits and vegetables, whole grains, low-fat dairy products, and lean protein. Avoiding foods with added fat, salt, and sugar. Drink plenty of water while you exercise to prevent dehydration or heat stroke.  Activity Choose an activity that you enjoy and set realistic goals. Your health care provider can help you make an exercise plan that works for you. Exercise at a moderate or vigorous intensity most days of the week. The intensity of exercise may vary from person to person. You can tell how intense a workout is for you by paying attention to your breathing and heartbeat. Most people will notice their breathing and heartbeat get faster with more intense exercise. Do resistance training twice each week, such as: Push-ups. Sit-ups. Lifting weights. Using resistance bands. Getting short amounts of exercise can be just as helpful as long structured periods of exercise. If you have trouble finding time to exercise, try to include exercise in your daily routine. Get up, stretch, and walk around every 30 minutes throughout the day. Go for a walk during your lunch break. Park your car farther away from your destination. If you take public transportation, get off one stop early and walk the rest of the way. Make phone calls while standing up and   walking around. Take the stairs instead of elevators or escalators. Wear comfortable clothes and shoes with  good support. Do not exercise so much that you hurt yourself, feel dizzy, or get very short of breath. Where to find more information [place] Department of Health and Human Services: www.hhs.gov Centers for Disease Control and Prevention (CDC): www.cdc.gov Contact a health care provider: Before starting a new exercise program. If you have questions or concerns about your weight. If you have a medical problem that keeps you from exercising. Get help right away if you have any of the following while exercising: Injury. Dizziness. Difficulty breathing or shortness of breath that does not go away when you stop exercising. Chest pain. Rapid heartbeat. Summary Being overweight increases your risk of heart disease, stroke, diabetes, high blood pressure, and several types of cancer. Losing weight happens when you burn more calories than you eat. Reducing the amount of calories you eat in addition to getting regular moderate or vigorous exercise each week helps you lose weight. This information is not intended to replace advice given to you by your health care provider. Make sure you discuss any questions you have with your healthcare provider. Document Revised: 12/14/2019 Document Reviewed: 12/31/2019 Elsevier Patient Education  2022 Elsevier Inc.  

## 2021-05-17 LAB — BMP8+EGFR
BUN/Creatinine Ratio: 12 (ref 9–23)
BUN: 9 mg/dL (ref 6–24)
CO2: 25 mmol/L (ref 20–29)
Calcium: 10.2 mg/dL (ref 8.7–10.2)
Chloride: 106 mmol/L (ref 96–106)
Creatinine, Ser: 0.74 mg/dL (ref 0.57–1.00)
Glucose: 78 mg/dL (ref 65–99)
Potassium: 4.6 mmol/L (ref 3.5–5.2)
Sodium: 144 mmol/L (ref 134–144)
eGFR: 99 mL/min/{1.73_m2} (ref >59)

## 2021-05-17 LAB — HEMOGLOBIN A1C
Est. average glucose Bld gHb Est-mCnc: 111 mg/dL
Hgb A1c MFr Bld: 5.5 % (ref 4.8–5.6)

## 2021-05-19 ENCOUNTER — Other Ambulatory Visit: Payer: Self-pay | Admitting: Nurse Practitioner

## 2021-05-19 DIAGNOSIS — Z6836 Body mass index (BMI) 36.0-36.9, adult: Secondary | ICD-10-CM

## 2021-05-24 ENCOUNTER — Encounter: Payer: Self-pay | Admitting: Nurse Practitioner

## 2021-06-16 ENCOUNTER — Other Ambulatory Visit: Payer: Self-pay | Admitting: Nurse Practitioner

## 2021-06-16 DIAGNOSIS — Z6836 Body mass index (BMI) 36.0-36.9, adult: Secondary | ICD-10-CM

## 2021-07-17 ENCOUNTER — Ambulatory Visit: Payer: BC Managed Care – PPO | Admitting: Nurse Practitioner

## 2021-07-21 ENCOUNTER — Other Ambulatory Visit: Payer: Self-pay | Admitting: Nurse Practitioner

## 2021-07-21 DIAGNOSIS — Z6836 Body mass index (BMI) 36.0-36.9, adult: Secondary | ICD-10-CM

## 2021-07-23 ENCOUNTER — Other Ambulatory Visit: Payer: Self-pay | Admitting: Nurse Practitioner

## 2021-07-23 DIAGNOSIS — I10 Essential (primary) hypertension: Secondary | ICD-10-CM

## 2021-08-16 ENCOUNTER — Other Ambulatory Visit: Payer: Self-pay

## 2021-08-16 DIAGNOSIS — E559 Vitamin D deficiency, unspecified: Secondary | ICD-10-CM

## 2021-08-16 MED ORDER — VITAMIN D (ERGOCALCIFEROL) 1.25 MG (50000 UNIT) PO CAPS
50000.0000 [IU] | ORAL_CAPSULE | ORAL | 0 refills | Status: DC
Start: 2021-08-16 — End: 2021-11-13

## 2021-11-03 DIAGNOSIS — H6121 Impacted cerumen, right ear: Secondary | ICD-10-CM | POA: Diagnosis not present

## 2021-11-03 DIAGNOSIS — J019 Acute sinusitis, unspecified: Secondary | ICD-10-CM | POA: Diagnosis not present

## 2021-11-13 ENCOUNTER — Other Ambulatory Visit: Payer: Self-pay | Admitting: Nurse Practitioner

## 2021-11-13 ENCOUNTER — Other Ambulatory Visit: Payer: Self-pay | Admitting: Obstetrics and Gynecology

## 2021-11-13 DIAGNOSIS — N6489 Other specified disorders of breast: Secondary | ICD-10-CM

## 2021-11-13 DIAGNOSIS — E559 Vitamin D deficiency, unspecified: Secondary | ICD-10-CM

## 2021-11-14 ENCOUNTER — Ambulatory Visit (INDEPENDENT_AMBULATORY_CARE_PROVIDER_SITE_OTHER): Payer: BC Managed Care – PPO | Admitting: Nurse Practitioner

## 2021-11-14 ENCOUNTER — Other Ambulatory Visit: Payer: Self-pay

## 2021-11-14 ENCOUNTER — Encounter: Payer: Self-pay | Admitting: Nurse Practitioner

## 2021-11-14 ENCOUNTER — Other Ambulatory Visit (HOSPITAL_COMMUNITY): Payer: Self-pay

## 2021-11-14 VITALS — BP 118/80 | HR 85 | Temp 98.1°F | Ht 67.0 in | Wt 246.8 lb

## 2021-11-14 DIAGNOSIS — Z23 Encounter for immunization: Secondary | ICD-10-CM

## 2021-11-14 DIAGNOSIS — Z6837 Body mass index (BMI) 37.0-37.9, adult: Secondary | ICD-10-CM

## 2021-11-14 DIAGNOSIS — Z0000 Encounter for general adult medical examination without abnormal findings: Secondary | ICD-10-CM | POA: Diagnosis not present

## 2021-11-14 DIAGNOSIS — I10 Essential (primary) hypertension: Secondary | ICD-10-CM

## 2021-11-14 DIAGNOSIS — E559 Vitamin D deficiency, unspecified: Secondary | ICD-10-CM | POA: Diagnosis not present

## 2021-11-14 DIAGNOSIS — R7303 Prediabetes: Secondary | ICD-10-CM | POA: Diagnosis not present

## 2021-11-14 DIAGNOSIS — E6609 Other obesity due to excess calories: Secondary | ICD-10-CM

## 2021-11-14 DIAGNOSIS — E669 Obesity, unspecified: Secondary | ICD-10-CM

## 2021-11-14 LAB — POCT URINALYSIS DIPSTICK
Bilirubin, UA: NEGATIVE
Glucose, UA: NEGATIVE
Ketones, UA: NEGATIVE
Leukocytes, UA: NEGATIVE
Nitrite, UA: NEGATIVE
Protein, UA: NEGATIVE
Spec Grav, UA: >=1.03 — AB (ref 1.010–1.025)
Urobilinogen, UA: 0.2 E.U./dL
pH, UA: 6 (ref 5.0–8.0)

## 2021-11-14 MED ORDER — WEGOVY 0.5 MG/0.5ML ~~LOC~~ SOAJ
0.5000 mg | SUBCUTANEOUS | 0 refills | Status: DC
  Filled 2021-11-14: qty 2, 28d supply, fill #0

## 2021-11-14 NOTE — Progress Notes (Signed)
I,Victoria T Hamilton,acting as a Education administrator for Minette Brine, FNP.,have documented all relevant documentation on the behalf of Minette Brine, FNP,as directed by  Minette Brine, FNP while in the presence of Minette Brine, [place].   This visit occurred during the SARS-CoV-2 public health emergency.  Safety protocols were in place, including screening questions prior to the visit, additional usage of staff PPE, and extensive cleaning of exam room while observing appropriate contact time as indicated for disinfecting solutions.  Subjective:     Patient ID: Jane Armstrong , female    DOB: [birth year] , 51 y.o.   MRN: [id number]   Chief Complaint  Patient presents with   Annual Exam    HPI  HM. Pt would like to start a weight loss medication. She has her GYN needs done by Vanessa Kick at [facility].  She had been taking Plenity - did not feel it was helpful - her weight loss started out slow. Saxenda worked better for her.   Wt Readings from Last 3 Encounters: 11/14/21 : 246 lb 12.8 oz (111.9 kg) 05/16/21 : 237 lb (107.5 kg) 03/28/21 : 236 lb 3.2 oz (107.1 kg)      Past Medical History:  Diagnosis Date   Hypertension    Obesity      Family History  Problem Relation Age of Onset   Hypertension Mother    Diabetes Mother    Breast cancer Mother      Current Outpatient Medications:    cetirizine (ZYRTEC) 10 MG tablet, Take 1 tablet (10 mg total) by mouth daily., Disp: 90 tablet, Rfl: 1   EPINEPHrine 0.3 mg/0.3 mL IJ SOAJ injection, Inject into the muscle once. Use as directed, Disp: , Rfl:    ibuprofen (ADVIL) 800 MG tablet, TAKE 1 TABLET BY MOUTH 3 TIMES A DAY WITH FOOD AS NEEDED, Disp: 90 tablet, Rfl: 0   Semaglutide-Weight Management (WEGOVY) 0.5 MG/0.5ML SOAJ, Inject 0.5 mg into the skin once a week., Disp: 2 mL, Rfl: 0   telmisartan-hydrochlorothiazide (MICARDIS HCT) 40-12.5 MG tablet, TAKE 1 TABLET BY MOUTH DAILY, Disp: 90 tablet, Rfl: 1   Vitamin D, Ergocalciferol, (DRISDOL)  1.25 MG (50000 UNIT) CAPS capsule, TAKE 1 CAPSULE BY MOUTH 1 TIME A WEEK, Disp: 12 capsule, Rfl: 0   Carboxymeth-Cellulose-CitricAc (PLENITY) CAPS, Take 3 capsules (1 pod) by mouth with 16 ounces of water 20 to 30 minutes before lunch and before dinner as directed (Patient not taking: Reported on 11/14/2021), Disp: 1 capsule, Rfl: 0   Allergies  Allergen Reactions   Bee Venom Shortness Of Breath   Clindamycin/Lincomycin Hives   Latex Itching and Rash      The patient states she uses IUD for birth control.  Patient's last menstrual period was 10/23/2021.. Negative for Dysmenorrhea and Negative for Menorrhagia. Negative for: breast discharge, breast lump(s), breast pain and breast self exam. Associated symptoms include abnormal vaginal bleeding. Pertinent negatives include abnormal bleeding (hematology), anxiety, decreased libido, depression, difficulty falling sleep, dyspareunia, history of infertility, nocturia, sexual dysfunction, sleep disturbances, urinary incontinence, urinary urgency, vaginal discharge and vaginal itching. Diet regular, does admit to eating poorly since the holidays. The patient states her exercise level is 4-5 days a week, cardio (running, burpees) and free weights.  Mostly at home due to being sick.    The patient's tobacco use is:  Social History   Tobacco Use  Smoking Status Never  Smokeless Tobacco Never   She has been exposed to passive smoke. The patient's alcohol use is:  Social History   Substance and Sexual Activity  Alcohol Use Yes   Comment: socially   Additional information: Last pap 10/05/2019, next one scheduled for 10/04/2022.    Review of Systems  Constitutional: Negative.   HENT: Negative.    Eyes: Negative.   Respiratory: Negative.    Cardiovascular: Negative.   Gastrointestinal: Negative.   Endocrine: Negative.   Genitourinary: Negative.   Musculoskeletal: Negative.   Skin: Negative.   Allergic/Immunologic: Negative.   Neurological:  Negative.   Hematological: Negative.   Psychiatric/Behavioral: Negative.      Today's Vitals   11/14/21 0838  BP: 118/80  Pulse: 85  Temp: 98.1 F (36.7 C)  Weight: 246 lb 12.8 oz (111.9 kg)  Height: 5' 7" (1.702 m)   Body mass index is 38.65 kg/m.  Wt Readings from Last 3 Encounters:  11/14/21 246 lb 12.8 oz (111.9 kg)  05/16/21 237 lb (107.5 kg)  03/28/21 236 lb 3.2 oz (107.1 kg)    Objective:  Physical Exam Vitals reviewed.  Constitutional:      General: She is not in acute distress.    Appearance: Normal appearance. She is well-developed. She is obese.  HENT:     Head: Normocephalic and atraumatic.     Right Ear: Hearing, tympanic membrane, ear canal and external ear normal. There is no impacted cerumen.     Left Ear: Hearing, tympanic membrane, ear canal and external ear normal. There is no impacted cerumen.     Nose: Nose normal.     Mouth/Throat:     Mouth: Mucous membranes are dry.  Eyes:     General: Lids are normal.     Extraocular Movements: Extraocular movements intact.     Conjunctiva/sclera: Conjunctivae normal.     Pupils: Pupils are equal, round, and reactive to light.     Funduscopic exam:    Right eye: No papilledema.        Left eye: No papilledema.  Neck:     Thyroid: No thyroid mass.     Vascular: No carotid bruit.  Cardiovascular:     Rate and Rhythm: Normal rate and regular rhythm.     Pulses: Normal pulses.     Heart sounds: Normal heart sounds. No murmur heard. Pulmonary:     Effort: Pulmonary effort is normal. No respiratory distress.     Breath sounds: Normal breath sounds. No wheezing.  Abdominal:     General: Abdomen is flat. Bowel sounds are normal.     Palpations: Abdomen is soft.  Genitourinary:    Comments: Deferred - followed by GYN Musculoskeletal:        General: No swelling. Normal range of motion.     Cervical back: Full passive range of motion without pain, normal range of motion and neck supple.     Right lower leg: No  edema.     Left lower leg: No edema.  Skin:    General: Skin is warm and dry.     Capillary Refill: Capillary refill takes less than 2 seconds.  Neurological:     General: No focal deficit present.     Mental Status: She is alert and oriented to person, place, and time.     Cranial Nerves: No cranial nerve deficit.     Sensory: No sensory deficit.     Motor: No weakness.  Psychiatric:        Mood and Affect: Mood normal.        Behavior: Behavior normal.  Thought Content: Thought content normal.        Judgment: Judgment normal.        Assessment And Plan:     1. Health maintenance examination Behavior modifications discussed and diet history reviewed.   Pt will continue to exercise regularly and modify diet with low GI, plant based foods and decrease intake of processed foods.  Recommend intake of daily multivitamin, Vitamin D, and calcium.  Recommend mammogram and colonoscopy for preventive screenings, as well as recommend immunizations that include influenza, TDAP, and Shingles - POCT Urinalysis Dipstick (81002) - Microalbumin / creatinine urine ratio - EKG 12-Lead  2. Essential hypertension Comments: Blood pressure is controlled, continue current medications. EKG done with NSR HR 83 - POCT Urinalysis Dipstick (81002) - Microalbumin / creatinine urine ratio - EKG 12-Lead - CMP14+EGFR - CBC - Hemoglobin A1c - Lipid panel  3. Prediabetes Comments: Stable at last visit, no current medications - POCT Urinalysis Dipstick (81002) - Microalbumin / creatinine urine ratio - EKG 12-Lead - CMP14+EGFR - CBC - Hemoglobin A1c - Lipid panel  4. Vitamin D deficiency Will check vitamin D level and supplement as needed.    Also encouraged to spend 15 minutes in the sun daily.  - VITAMIN D 25 Hydroxy (Vit-D Deficiency, Fractures)  5. Class 2 obesity without serious comorbidity with body mass index (BMI) of 37.0 to 37.9 in adult, unspecified obesity type Comments: She has  gained approximately 9 lbs since her last visit, will start on [facility] pending insurance approval sample given. Discussed side effects to include nausea and throat discomfort, she has taken Saxenda in the past. She is encouraged to strive for BMI less than 30 to decrease cardiac risk. Advised to aim for at least 150 minutes of exercise per week.  - Semaglutide-Weight Management (WEGOVY) 0.5 MG/0.5ML SOAJ; Inject 0.5 mg into the skin once a week.  Dispense: 2 mL; Refill: 0  6. Immunization due Influenza vaccine administered Encouraged to take Tylenol as needed for fever or muscle aches. - Flu Vaccine QUAD 6+ mos PF IM (Fluarix Quad PF)   Patient was given opportunity to ask questions. Patient verbalized understanding of the plan and was able to repeat key elements of the plan. All questions were answered to their satisfaction.   Minette Brine, FNP   I, Minette Brine, FNP, have reviewed all documentation for this visit. The documentation on 11/14/21 for the exam, diagnosis, procedures, and orders are all accurate and complete.  THE PATIENT IS ENCOURAGED TO PRACTICE SOCIAL DISTANCING DUE TO THE COVID-19 PANDEMIC.

## 2021-11-14 NOTE — Patient Instructions (Signed)

## 2021-11-15 ENCOUNTER — Other Ambulatory Visit (HOSPITAL_COMMUNITY): Payer: Self-pay

## 2021-11-15 LAB — MICROALBUMIN / CREATININE URINE RATIO
Creatinine, Urine: 291.7 mg/dL
Microalb/Creat Ratio: 10 mg/g creat (ref 0–29)
Microalbumin, Urine: 29 ug/mL

## 2021-11-16 ENCOUNTER — Encounter: Payer: Self-pay | Admitting: Nurse Practitioner

## 2021-11-21 LAB — CMP14+EGFR
ALT: 15 IU/L (ref 0–32)
AST: 11 IU/L (ref 0–40)
Albumin/Globulin Ratio: 1.5 (ref 1.2–2.2)
Albumin: 4.7 g/dL (ref 3.8–4.8)
Alkaline Phosphatase: 79 IU/L (ref 44–121)
BUN/Creatinine Ratio: 15 (ref 9–23)
BUN: 12 mg/dL (ref 6–24)
Bilirubin Total: 0.4 mg/dL (ref 0.0–1.2)
CO2: 25 mmol/L (ref 20–29)
Calcium: 9.7 mg/dL (ref 8.7–10.2)
Chloride: 106 mmol/L (ref 96–106)
Creatinine, Ser: 0.8 mg/dL (ref 0.57–1.00)
Globulin, Total: 3.1 g/dL (ref 1.5–4.5)
Glucose: 82 mg/dL (ref 70–99)
Potassium: 4.3 mmol/L (ref 3.5–5.2)
Sodium: 144 mmol/L (ref 134–144)
Total Protein: 7.8 g/dL (ref 6.0–8.5)
eGFR: 90 mL/min/{1.73_m2} (ref >59)

## 2021-11-21 LAB — CBC
Hematocrit: 41.5 % (ref 34.0–46.6)
Hemoglobin: 13.8 g/dL (ref 11.1–15.9)
MCH: 27.8 pg (ref 26.6–33.0)
MCHC: 33.3 g/dL (ref 31.5–35.7)
MCV: 84 fL (ref 79–97)
Platelets: 269 10*3/uL (ref 150–450)
RBC: 4.96 x10E6/uL (ref 3.77–5.28)
RDW: 12.8 % (ref 11.7–15.4)
WBC: 7.6 10*3/uL (ref 3.4–10.8)

## 2021-11-21 LAB — LIPID PANEL
Chol/HDL Ratio: 2.1 ratio (ref 0.0–4.4)
Cholesterol, Total: 124 mg/dL (ref 100–199)
HDL: 58 mg/dL (ref >39)
LDL Chol Calc (NIH): 55 mg/dL (ref 0–99)
Triglycerides: 47 mg/dL (ref 0–149)
VLDL Cholesterol Cal: 11 mg/dL (ref 5–40)

## 2021-11-21 LAB — VITAMIN D 25 HYDROXY (VIT D DEFICIENCY, FRACTURES): Vit D, 25-Hydroxy: 45.2 ng/mL (ref 30.0–100.0)

## 2021-11-21 LAB — HEMOGLOBIN A1C
Est. average glucose Bld gHb Est-mCnc: 114 mg/dL
Hgb A1c MFr Bld: 5.6 % (ref 4.8–5.6)

## 2021-11-29 ENCOUNTER — Other Ambulatory Visit: Payer: Self-pay | Admitting: Nurse Practitioner

## 2021-11-29 ENCOUNTER — Encounter: Payer: Self-pay | Admitting: Nurse Practitioner

## 2021-11-29 DIAGNOSIS — E559 Vitamin D deficiency, unspecified: Secondary | ICD-10-CM

## 2021-11-29 MED ORDER — VITAMIN D (ERGOCALCIFEROL) 1.25 MG (50000 UNIT) PO CAPS: 50000.0000 [IU] | ORAL_CAPSULE | Freq: Once | ORAL | 0 refills | Status: AC

## 2021-11-29 MED ORDER — EPINEPHRINE 0.3 MG/0.3ML IJ SOAJ: 0.3000 mg | Freq: Once | INTRAMUSCULAR | 2 refills | Status: DC

## 2021-12-04 ENCOUNTER — Other Ambulatory Visit: Payer: Self-pay

## 2021-12-04 MED ORDER — CETIRIZINE HCL 10 MG PO TABS: 10.0000 mg | ORAL_TABLET | Freq: Every day | ORAL | 1 refills | Status: DC

## 2021-12-11 ENCOUNTER — Other Ambulatory Visit: Payer: Self-pay

## 2021-12-11 DIAGNOSIS — E669 Obesity, unspecified: Secondary | ICD-10-CM

## 2021-12-11 MED ORDER — SAXENDA 18 MG/3ML ~~LOC~~ SOPN: PEN_INJECTOR | SUBCUTANEOUS | 1 refills | Status: DC

## 2022-01-03 ENCOUNTER — Other Ambulatory Visit: Payer: Self-pay | Admitting: Nurse Practitioner

## 2022-01-03 NOTE — Progress Notes (Signed)
Called patient to discuss about her weight loss medications her insurance does not cover weight loss drugs.  ?

## 2022-01-19 ENCOUNTER — Ambulatory Visit
Admission: RE | Admit: 2022-01-19 | Discharge: 2022-01-19 | Disposition: A | Discharge status: Home / Self Care | Payer: BC Managed Care – PPO | Source: Ambulatory Visit | Attending: Obstetrics and Gynecology | Admitting: Obstetrics and Gynecology

## 2022-01-19 DIAGNOSIS — N6489 Other specified disorders of breast: Secondary | ICD-10-CM | POA: Diagnosis not present

## 2022-02-23 ENCOUNTER — Other Ambulatory Visit: Payer: Self-pay

## 2022-02-23 DIAGNOSIS — I10 Essential (primary) hypertension: Secondary | ICD-10-CM

## 2022-02-23 MED ORDER — TELMISARTAN-HCTZ 40-12.5 MG PO TABS: 1.0000 | ORAL_TABLET | Freq: Every day | ORAL | 1 refills | Status: DC

## 2022-03-05 ENCOUNTER — Encounter: Payer: Self-pay | Admitting: Nurse Practitioner

## 2022-03-15 ENCOUNTER — Ambulatory Visit: Payer: BC Managed Care – PPO | Admitting: Nurse Practitioner

## 2022-03-15 ENCOUNTER — Encounter: Payer: Self-pay | Admitting: Nurse Practitioner

## 2022-03-15 VITALS — BP 134/68 | HR 77 | Temp 98.3°F | Ht 67.0 in | Wt 248.8 lb

## 2022-03-15 DIAGNOSIS — Z6838 Body mass index (BMI) 38.0-38.9, adult: Secondary | ICD-10-CM

## 2022-03-15 DIAGNOSIS — I10 Essential (primary) hypertension: Secondary | ICD-10-CM

## 2022-03-15 NOTE — Progress Notes (Signed)
Barnet [place] Martin,acting as a Education administrator for Minette Brine, FNP.,have documented all relevant documentation on the behalf of Minette Brine, FNP,as directed by  Minette Brine, FNP while in the presence of Minette Brine, [place].   Subjective:     Patient ID: Jane Armstrong , female    DOB: 12-Aug-1971 , 51 y.o.   MRN: [id number]   Chief Complaint  Patient presents with   Hypertension    HPI  Patient is here today for a blood pressure check. Patient has no other complaints.  Continues to exercise 6 days a week. She reports her telmisartan is not going to be covered but will see how much for cash pay  Wt Readings from Last 3 Encounters: 03/15/22 : 248 lb 12.8 oz (112.9 kg) 11/14/21 : 246 lb 12.8 oz (111.9 kg) 05/16/21 : 237 lb (107.5 kg)    Hypertension This is a chronic problem. The current episode started more than 1 year ago. The problem is controlled. Pertinent negatives include no anxiety, chest pain or palpitations. There are no associated agents to hypertension. Risk factors for coronary artery disease include obesity.     Past Medical History:  Diagnosis Date   Hypertension    Obesity      Family History  Problem Relation Age of Onset   Hypertension Mother    Diabetes Mother    Breast cancer Mother 68     Current Outpatient Medications:    Carboxymeth-Cellulose-CitricAc (PLENITY) CAPS, Take 3 capsules (1 pod) by mouth with 16 ounces of water 20 to 30 minutes before lunch and before dinner as directed, Disp: 1 capsule, Rfl: 0   cetirizine (ZYRTEC) 10 MG tablet, Take 1 tablet (10 mg total) by mouth daily., Disp: 90 tablet, Rfl: 1   ibuprofen (ADVIL) 800 MG tablet, TAKE 1 TABLET BY MOUTH 3 TIMES A DAY WITH FOOD AS NEEDED, Disp: 90 tablet, Rfl: 0   telmisartan-hydrochlorothiazide (MICARDIS HCT) 40-12.5 MG tablet, Take 1 tablet by mouth daily., Disp: 90 tablet, Rfl: 1   Allergies  Allergen Reactions   Bee Venom Shortness Of Breath   Clindamycin/Lincomycin Hives   Latex  Itching and Rash     Review of Systems  Constitutional: Negative.   Respiratory: Negative.    Cardiovascular: Negative.  Negative for chest pain, palpitations and leg swelling.  Gastrointestinal: Negative.   Neurological: Negative.   Psychiatric/Behavioral: Negative.       Today's Vitals   03/15/22 1220  BP: 134/68  Pulse: 77  Temp: 98.3 F (36.8 C)  TempSrc: Oral  Weight: 248 lb 12.8 oz (112.9 kg)  Height: _0  (1.702 m)   Body mass index is 38.97 kg/m.  Wt Readings from Last 3 Encounters:  03/15/22 248 lb 12.8 oz (112.9 kg)  11/14/21 246 lb 12.8 oz (111.9 kg)  05/16/21 237 lb (107.5 kg)     Objective:  Physical Exam Vitals reviewed.  Constitutional:      General: She is not in acute distress.    Appearance: Normal appearance. She is obese.  Cardiovascular:     Rate and Rhythm: Normal rate and regular rhythm.     Pulses: Normal pulses.     Heart sounds: Normal heart sounds. No murmur heard. Pulmonary:     Effort: Pulmonary effort is normal. No respiratory distress.     Breath sounds: Normal breath sounds. No wheezing.  Neurological:     General: No focal deficit present.     Mental Status: She is alert and oriented to person, place, and  time.     Cranial Nerves: No cranial nerve deficit.     Motor: No weakness.  Psychiatric:        Mood and Affect: Mood normal.        Behavior: Behavior normal.        Thought Content: Thought content normal.        Judgment: Judgment normal.         Assessment And Plan:     1. Essential hypertension Comments: Blood pressure is controlled, continue current medications  - BMP8+EGFR  2. Class 2 severe obesity due to excess calories with serious comorbidity and body mass index (BMI) of 38.0 to 38.9 in adult [facility]) Comments: Weight is slightly up but continues to exercise regularly. She will check with her insurance company about any weight loss meds approved She is encouraged to strive for BMI less than 30 to decrease  cardiac risk. Advised to aim for at least 150 minutes of exercise per week.    Patient was given opportunity to ask questions. Patient verbalized understanding of the plan and was able to repeat key elements of the plan. All questions were answered to their satisfaction.  Minette Brine, FNP   I, Minette Brine, FNP, have reviewed all documentation for this visit. The documentation on 03/15/22 for the exam, diagnosis, procedures, and orders are all accurate and complete.   IF YOU HAVE BEEN REFERRED TO A SPECIALIST, IT MAY TAKE 1-2 WEEKS TO SCHEDULE/PROCESS THE REFERRAL. IF YOU HAVE NOT HEARD FROM US/SPECIALIST IN TWO WEEKS, PLEASE GIVE [place] A CALL AT [phone].   THE PATIENT IS ENCOURAGED TO PRACTICE SOCIAL DISTANCING DUE TO THE COVID-19 PANDEMIC.

## 2022-03-15 NOTE — Patient Instructions (Addendum)
Hypertension, Adult Hypertension is another name for high blood pressure. High blood pressure forces your heart to work harder to pump blood. This can cause problems over time. There are two numbers in a blood pressure reading. There is a top number (systolic) over a bottom number (diastolic). It is best to have a blood pressure that is below 120/80. What are the causes? The cause of this condition is not known. Some other conditions can lead to high blood pressure. What increases the risk? Some lifestyle factors can make you more likely to develop high blood pressure: Smoking. Not getting enough exercise or physical activity. Being overweight. Having too much fat, sugar, calories, or salt (sodium) in your diet. Drinking too much alcohol. Other risk factors include: Having any of these conditions: Heart disease. Diabetes. High cholesterol. Kidney disease. Obstructive sleep apnea. Having a family history of high blood pressure and high cholesterol. Age. The risk increases with age. Stress. What are the signs or symptoms? High blood pressure may not cause symptoms. Very high blood pressure (hypertensive crisis) may cause: Headache. Fast or uneven heartbeats (palpitations). Shortness of breath. Nosebleed. Vomiting or feeling like you may vomit (nauseous). Changes in how you see. Very bad chest pain. Feeling dizzy. Seizures. How is this treated? This condition is treated by making healthy lifestyle changes, such as: Eating healthy foods. Exercising more. Drinking less alcohol. Your doctor may prescribe medicine if lifestyle changes do not help enough and if: Your top number is above 130. Your bottom number is above 80. Your personal target blood pressure may vary. Follow these instructions at home: Eating and drinking  If told, follow the DASH eating plan. To follow this plan: Fill one half of your plate at each meal with fruits and vegetables. Fill one fourth of your plate  at each meal with whole grains. Whole grains include whole-wheat pasta, brown rice, and whole-grain bread. Eat or drink low-fat dairy products, such as skim milk or low-fat yogurt. Fill one fourth of your plate at each meal with low-fat (lean) proteins. Low-fat proteins include fish, chicken without skin, eggs, beans, and tofu. Avoid fatty meat, cured and processed meat, or chicken with skin. Avoid pre-made or processed food. Limit the amount of salt in your diet to less than 1,500 mg each day. Do not drink alcohol if: Your doctor tells you not to drink. You are pregnant, may be pregnant, or are planning to become pregnant. If you drink alcohol: Limit how much you have to: 0-1 drink a day for women. 0-2 drinks a day for men. Know how much alcohol is in your drink. In the [place] one drink equals one 12 oz bottle of beer (355 mL), one 5 oz glass of wine (148 mL), or one 1 oz glass of hard liquor (44 mL). Lifestyle  Work with your doctor to stay at a healthy weight or to lose weight. Ask your doctor what the best weight is for you. Get at least 30 minutes of exercise that causes your heart to beat faster (aerobic exercise) most days of the week. This may include walking, swimming, or biking. Get at least 30 minutes of exercise that strengthens your muscles (resistance exercise) at least 3 days a week. This may include lifting weights or doing Pilates. Do not smoke or use any products that contain nicotine or tobacco. If you need help quitting, ask your doctor. Check your blood pressure at home as told by your doctor. Keep all follow-up visits. Medicines Take over-the-counter and prescription medicines  only as told by your doctor. Follow directions carefully. Do not skip doses of blood pressure medicine. The medicine does not work as well if you skip doses. Skipping doses also puts you at risk for problems. Ask your doctor about side effects or reactions to medicines that you should watch  for. Contact a doctor if: You think you are having a reaction to the medicine you are taking. You have headaches that keep coming back. You feel dizzy. You have swelling in your ankles. You have trouble with your vision. Get help right away if: You get a very bad headache. You start to feel mixed up (confused). You feel weak or numb. You feel faint. You have very bad pain in your: Chest. Belly (abdomen). You vomit more than once. You have trouble breathing. These symptoms may be an emergency. Get help right away. Call 911. Do not wait to see if the symptoms will go away. Do not drive yourself to the hospital. Summary Hypertension is another name for high blood pressure. High blood pressure forces your heart to work harder to pump blood. For most people, a normal blood pressure is less than 120/80. Making healthy choices can help lower blood pressure. If your blood pressure does not get lower with healthy choices, you may need to take medicine. This information is not intended to replace advice given to you by your health care provider. Make sure you discuss any questions you have with your health care provider. Document Revised: 06/22/2021 Document Reviewed: 06/22/2021 Elsevier Patient Education  2023 Elsevier Inc.   [email or web address]

## 2022-03-16 LAB — BMP8+EGFR
BUN/Creatinine Ratio: 14 (ref 9–23)
BUN: 11 mg/dL (ref 6–24)
CO2: 27 mmol/L (ref 20–29)
Calcium: 9.8 mg/dL (ref 8.7–10.2)
Chloride: 105 mmol/L (ref 96–106)
Creatinine, Ser: 0.8 mg/dL (ref 0.57–1.00)
Glucose: 85 mg/dL (ref 70–99)
Potassium: 3.9 mmol/L (ref 3.5–5.2)
Sodium: 146 mmol/L — ABNORMAL HIGH (ref 134–144)
eGFR: 89 mL/min/{1.73_m2} (ref >59)

## 2022-06-12 ENCOUNTER — Encounter: Payer: Self-pay | Admitting: Nurse Practitioner

## 2022-06-14 ENCOUNTER — Other Ambulatory Visit: Payer: Self-pay

## 2022-06-14 MED ORDER — TELMISARTAN 40 MG PO TABS: 40.0000 mg | ORAL_TABLET | Freq: Every day | ORAL | 1 refills | Status: DC

## 2022-06-14 MED ORDER — HYDROCHLOROTHIAZIDE 25 MG PO TABS: 25.0000 mg | ORAL_TABLET | Freq: Every day | ORAL | 1 refills | Status: DC

## 2022-06-26 DIAGNOSIS — Z6837 Body mass index (BMI) 37.0-37.9, adult: Secondary | ICD-10-CM | POA: Diagnosis not present

## 2022-06-26 DIAGNOSIS — Z01419 Encounter for gynecological examination (general) (routine) without abnormal findings: Secondary | ICD-10-CM | POA: Diagnosis not present

## 2022-07-09 ENCOUNTER — Encounter: Payer: Self-pay | Admitting: Nurse Practitioner

## 2022-07-10 ENCOUNTER — Other Ambulatory Visit: Payer: Self-pay

## 2022-07-10 MED ORDER — CETIRIZINE HCL 10 MG PO TABS: 10.0000 mg | ORAL_TABLET | Freq: Every day | ORAL | 1 refills | Status: DC

## 2022-07-12 ENCOUNTER — Ambulatory Visit: Payer: BC Managed Care – PPO | Admitting: Nurse Practitioner

## 2022-07-18 ENCOUNTER — Ambulatory Visit: Payer: BC Managed Care – PPO | Admitting: Nurse Practitioner

## 2022-07-18 ENCOUNTER — Encounter: Payer: Self-pay | Admitting: Nurse Practitioner

## 2022-07-18 VITALS — BP 122/80 | HR 67 | Temp 98.3°F | Ht 67.0 in | Wt 248.0 lb

## 2022-07-18 DIAGNOSIS — Z6838 Body mass index (BMI) 38.0-38.9, adult: Secondary | ICD-10-CM

## 2022-07-18 DIAGNOSIS — R7303 Prediabetes: Secondary | ICD-10-CM | POA: Diagnosis not present

## 2022-07-18 DIAGNOSIS — I10 Essential (primary) hypertension: Secondary | ICD-10-CM | POA: Diagnosis not present

## 2022-07-18 DIAGNOSIS — Z23 Encounter for immunization: Secondary | ICD-10-CM

## 2022-07-18 LAB — BMP8+EGFR
BUN/Creatinine Ratio: 12 (ref 9–23)
BUN: 10 mg/dL (ref 6–24)
CO2: 26 mmol/L (ref 20–29)
Calcium: 9.8 mg/dL (ref 8.7–10.2)
Chloride: 101 mmol/L (ref 96–106)
Creatinine, Ser: 0.81 mg/dL (ref 0.57–1.00)
Glucose: 85 mg/dL (ref 70–99)
Potassium: 4 mmol/L (ref 3.5–5.2)
Sodium: 142 mmol/L (ref 134–144)
eGFR: 88 mL/min/{1.73_m2} (ref >59)

## 2022-07-18 NOTE — Patient Instructions (Addendum)
Hypertension, Adult High blood pressure (hypertension) is when the force of blood pumping through the arteries is too strong. The arteries are the blood vessels that carry blood from the heart throughout the body. Hypertension forces the heart to work harder to pump blood and may cause arteries to become narrow or stiff. Untreated or uncontrolled hypertension can lead to a heart attack, heart failure, a stroke, kidney disease, and other problems. A blood pressure reading consists of a higher number over a lower number. Ideally, your blood pressure should be below 120/80. The first ("top") number is called the systolic pressure. It is a measure of the pressure in your arteries as your heart beats. The second ("bottom") number is called the diastolic pressure. It is a measure of the pressure in your arteries as the heart relaxes. What are the causes? The exact cause of this condition is not known. There are some conditions that result in high blood pressure. What increases the risk? Certain factors may make you more likely to develop high blood pressure. Some of these risk factors are under your control, including: Smoking. Not getting enough exercise or physical activity. Being overweight. Having too much fat, sugar, calories, or salt (sodium) in your diet. Drinking too much alcohol. Other risk factors include: Having a personal history of heart disease, diabetes, high cholesterol, or kidney disease. Stress. Having a family history of high blood pressure and high cholesterol. Having obstructive sleep apnea. Age. The risk increases with age. What are the signs or symptoms? High blood pressure may not cause symptoms. Very high blood pressure (hypertensive crisis) may cause: Headache. Fast or irregular heartbeats (palpitations). Shortness of breath. Nosebleed. Nausea and vomiting. Vision changes. Severe chest pain, dizziness, and seizures. How is this diagnosed? This condition is diagnosed by  measuring your blood pressure while you are seated, with your arm resting on a flat surface, your legs uncrossed, and your feet flat on the floor. The cuff of the blood pressure monitor will be placed directly against the skin of your upper arm at the level of your heart. Blood pressure should be measured at least twice using the same arm. Certain conditions can cause a difference in blood pressure between your right and left arms. If you have a high blood pressure reading during one visit or you have normal blood pressure with other risk factors, you may be asked to: Return on a different day to have your blood pressure checked again. Monitor your blood pressure at home for 1 week or longer. If you are diagnosed with hypertension, you may have other blood or imaging tests to help your health care provider understand your overall risk for other conditions. How is this treated? This condition is treated by making healthy lifestyle changes, such as eating healthy foods, exercising more, and reducing your alcohol intake. You may be referred for counseling on a healthy diet and physical activity. Your health care provider may prescribe medicine if lifestyle changes are not enough to get your blood pressure under control and if: Your systolic blood pressure is above 130. Your diastolic blood pressure is above 80. Your personal target blood pressure may vary depending on your medical conditions, your age, and other factors. Follow these instructions at home: Eating and drinking  Eat a diet that is high in fiber and potassium, and low in sodium, added sugar, and fat. An example of this eating plan is called the DASH diet. DASH stands for Dietary Approaches to Stop Hypertension. To eat this way: Eat   plenty of fresh fruits and vegetables. Try to fill one half of your plate at each meal with fruits and vegetables. Eat whole grains, such as whole-wheat pasta, brown rice, or whole-grain bread. Fill about one  fourth of your plate with whole grains. Eat or drink low-fat dairy products, such as skim milk or low-fat yogurt. Avoid fatty cuts of meat, processed or cured meats, and poultry with skin. Fill about one fourth of your plate with lean proteins, such as fish, chicken without skin, beans, eggs, or tofu. Avoid pre-made and processed foods. These tend to be higher in sodium, added sugar, and fat. Reduce your daily sodium intake. Many people with hypertension should eat less than 1,500 mg of sodium a day. Do not drink alcohol if: Your health care provider tells you not to drink. You are pregnant, may be pregnant, or are planning to become pregnant. If you drink alcohol: Limit how much you have to: 0-1 drink a day for women. 0-2 drinks a day for men. Know how much alcohol is in your drink. In the [place] one drink equals one 12 oz bottle of beer (355 mL), one 5 oz glass of wine (148 mL), or one 1 oz glass of hard liquor (44 mL). Lifestyle  Work with your health care provider to maintain a healthy body weight or to lose weight. Ask what an ideal weight is for you. Get at least 30 minutes of exercise that causes your heart to beat faster (aerobic exercise) most days of the week. Activities may include walking, swimming, or biking. Include exercise to strengthen your muscles (resistance exercise), such as Pilates or lifting weights, as part of your weekly exercise routine. Try to do these types of exercises for 30 minutes at least 3 days a week. Do not use any products that contain nicotine or tobacco. These products include cigarettes, chewing tobacco, and vaping devices, such as e-cigarettes. If you need help quitting, ask your health care provider. Monitor your blood pressure at home as told by your health care provider. Keep all follow-up visits. This is important. Medicines Take over-the-counter and prescription medicines only as told by your health care provider. Follow directions carefully. Blood  pressure medicines must be taken as prescribed. Do not skip doses of blood pressure medicine. Doing this puts you at risk for problems and can make the medicine less effective. Ask your health care provider about side effects or reactions to medicines that you should watch for. Contact a health care provider if you: Think you are having a reaction to a medicine you are taking. Have headaches that keep coming back (recurring). Feel dizzy. Have swelling in your ankles. Have trouble with your vision. Get help right away if you: Develop a severe headache or confusion. Have unusual weakness or numbness. Feel faint. Have severe pain in your chest or abdomen. Vomit repeatedly. Have trouble breathing. These symptoms may be an emergency. Get help right away. Call 911. Do not wait to see if the symptoms will go away. Do not drive yourself to the hospital. Summary Hypertension is when the force of blood pumping through your arteries is too strong. If this condition is not controlled, it may put you at risk for serious complications. Your personal target blood pressure may vary depending on your medical conditions, your age, and other factors. For most people, a normal blood pressure is less than 120/80. Hypertension is treated with lifestyle changes, medicines, or a combination of both. Lifestyle changes include losing weight, eating a healthy,   low-sodium diet, exercising more, and limiting alcohol. This information is not intended to replace advice given to you by your health care provider. Make sure you discuss any questions you have with your health care provider. Document Revised: 07/11/2021 Document Reviewed: 07/11/2021 Elsevier Patient Education  2023 Elsevier Inc.   Influenza (Flu) Vaccine (Inactivated or Recombinant): What You Need to Know 1. Why get vaccinated? Influenza vaccine can prevent influenza (flu). Flu is a contagious disease that spreads around the [place] every year,  usually between October and May. Anyone can get the flu, but it is more dangerous for some people. Infants and young children, people 65 years and older, pregnant people, and people with certain health conditions or a weakened immune system are at greatest risk of flu complications. Pneumonia, bronchitis, sinus infections, and ear infections are examples of flu-related complications. If you have a medical condition, such as heart disease, cancer, or diabetes, flu can make it worse. Flu can cause fever and chills, sore throat, muscle aches, fatigue, cough, headache, and runny or stuffy nose. Some people may have vomiting and diarrhea, though this is more common in children than adults. In an average year, thousands of people in the [place] die from flu, and many more are hospitalized. Flu vaccine prevents millions of illnesses and flu-related visits to the doctor each year. 2. Influenza vaccines CDC recommends everyone 6 months and older get vaccinated every flu season. Children 6 months through 8 years of age may need 2 doses during a single flu season. Everyone else needs only 1 dose each flu season. It takes about 2 weeks for protection to develop after vaccination. There are many flu viruses, and they are always changing. Each year a new flu vaccine is made to protect against the influenza viruses believed to be likely to cause disease in the upcoming flu season. Even when the vaccine doesn't exactly match these viruses, it may still provide some protection. Influenza vaccine does not cause flu. Influenza vaccine may be given at the same time as other vaccines. 3. Talk with your health care provider Tell your vaccination provider if the person getting the vaccine: Has had an allergic reaction after a previous dose of influenza vaccine, or has any severe, life-threatening allergies Has ever had Guillain-Barr Syndrome (also called "GBS") In some cases, your health care provider may decide to  postpone influenza vaccination until a future visit. Influenza vaccine can be administered at any time during pregnancy. People who are or will be pregnant during influenza season should receive inactivated influenza vaccine. People with minor illnesses, such as a cold, may be vaccinated. People who are moderately or severely ill should usually wait until they recover before getting influenza vaccine. Your health care provider can give you more information. 4. Risks of a vaccine reaction Soreness, redness, and swelling where the shot is given, fever, muscle aches, and headache can happen after influenza vaccination. There may be a very small increased risk of Guillain-Barr Syndrome (GBS) after inactivated influenza vaccine (the flu shot). Young children who get the flu shot along with pneumococcal vaccine (PCV13) and/or DTaP vaccine at the same time might be slightly more likely to have a seizure caused by fever. Tell your health care provider if a child who is getting flu vaccine has ever had a seizure. People sometimes faint after medical procedures, including vaccination. Tell your provider if you feel dizzy or have vision changes or ringing in the ears. As with any medicine, there is a very remote   chance of a vaccine causing a severe allergic reaction, other serious injury, or death. 5. What if there is a serious problem? An allergic reaction could occur after the vaccinated person leaves the clinic. If you see signs of a severe allergic reaction (hives, swelling of the face and throat, difficulty breathing, a fast heartbeat, dizziness, or weakness), call 9-1-1 and get the person to the nearest hospital. For other signs that concern you, call your health care provider. Adverse reactions should be reported to the Vaccine Adverse Event Reporting System (VAERS). Your health care provider will usually file this report, or you can do it yourself. Visit the VAERS website at www.vaers.hhs.gov or call  1-800-822-7967. VAERS is only for reporting reactions, and VAERS staff members do not give medical advice. 6. The National Vaccine Injury Compensation Program The National Vaccine Injury Compensation Program (VICP) is a federal program that was created to compensate people who may have been injured by certain vaccines. Claims regarding alleged injury or death due to vaccination have a time limit for filing, which may be as short as two years. Visit the VICP website at www.hrsa.gov/vaccinecompensation or call 1-800-338-2382 to learn about the program and about filing a claim. 7. How can I learn more? Ask your health care provider. Call your local or state health department. Visit the website of the Food and Drug Administration (FDA) for vaccine package inserts and additional information at www.fda.gov/vaccines-blood-biologics/vaccines. Contact the Centers for Disease Control and Prevention (CDC): Call 1-800-232-4636 (1-800-CDC-INFO) or Visit CDC's website at www.cdc.gov/flu. Source: CDC Vaccine Information Statement Inactivated Influenza Vaccine (04/22/2020) This same material is available at www.cdc.gov for no charge. This information is not intended to replace advice given to you by your health care provider. Make sure you discuss any questions you have with your health care provider. Document Revised: 08/01/2021 Document Reviewed: 05/25/2021 Elsevier Patient Education  2023 Elsevier Inc.  

## 2022-07-18 NOTE — Progress Notes (Signed)
I,Tianna Badgett,acting as a Education administrator for [employer], FNP.,have documented all relevant documentation on the behalf of Minette Brine, FNP,as directed by  Minette Brine, FNP while in the presence of Minette Brine, [place].  Subjective:     Patient ID: Jane Armstrong , female    DOB: 15-Jan-1971 , 51 y.o.   MRN: [id number]   Chief Complaint  Patient presents with   Hypertension    HPI  Patient is here today for a blood pressure check. Continues to exercise average 5 days. Continues to have her moments where she will eat bad. She has been at home cooking.   Hypertension This is a chronic problem. The current episode started more than 1 year ago. The problem is controlled. Pertinent negatives include no anxiety, chest pain or palpitations. There are no associated agents to hypertension. Risk factors for coronary artery disease include obesity. Past treatments include diuretics and angiotensin blockers. There are no compliance problems.  There is no history of angina.     Past Medical History:  Diagnosis Date   Hypertension    Obesity      Family History  Problem Relation Age of Onset   Hypertension Mother    Diabetes Mother    Breast cancer Mother 78     Current Outpatient Medications:    cetirizine (ZYRTEC) 10 MG tablet, Take 1 tablet (10 mg total) by mouth daily., Disp: 90 tablet, Rfl: 1   hydrochlorothiazide (HYDRODIURIL) 25 MG tablet, Take 1 tablet (25 mg total) by mouth daily., Disp: 90 tablet, Rfl: 1   ibuprofen (ADVIL) 800 MG tablet, TAKE 1 TABLET BY MOUTH 3 TIMES A DAY WITH FOOD AS NEEDED, Disp: 90 tablet, Rfl: 0   telmisartan (MICARDIS) 40 MG tablet, Take 1 tablet (40 mg total) by mouth daily., Disp: 90 tablet, Rfl: 1   Allergies  Allergen Reactions   Bee Venom Shortness Of Breath   Clindamycin/Lincomycin Hives   Latex Itching and Rash     Review of Systems  Constitutional: Negative.   Respiratory: Negative.    Cardiovascular: Negative.  Negative for chest pain  and palpitations.  Gastrointestinal: Negative.   Neurological: Negative.   Psychiatric/Behavioral: Negative.       Today's Vitals   07/18/22 1004  BP: 122/80  Pulse: 67  Temp: 98.3 F (36.8 C)  TempSrc: Oral  Weight: 248 lb (112.5 kg)  Height: _0  (1.702 m)   Body mass index is 38.84 kg/m.  Wt Readings from Last 3 Encounters:  07/18/22 248 lb (112.5 kg)  03/15/22 248 lb 12.8 oz (112.9 kg)  11/14/21 246 lb 12.8 oz (111.9 kg)    Objective:  Physical Exam Vitals reviewed.  Constitutional:      General: She is not in acute distress.    Appearance: Normal appearance. She is obese.  Cardiovascular:     Rate and Rhythm: Normal rate and regular rhythm.     Pulses: Normal pulses.     Heart sounds: Normal heart sounds. No murmur heard. Pulmonary:     Effort: Pulmonary effort is normal. No respiratory distress.     Breath sounds: Normal breath sounds. No wheezing.  Skin:    General: Skin is warm and dry.  Neurological:     General: No focal deficit present.     Mental Status: She is alert and oriented to person, place, and time.     Cranial Nerves: No cranial nerve deficit.     Motor: No weakness.  Psychiatric:  Mood and Affect: Mood normal.        Behavior: Behavior normal.        Thought Content: Thought content normal.        Judgment: Judgment normal.        Assessment And Plan:     1. Essential hypertension Comments: Blood pressure is controlled, continue current medications - BMP8+EGFR  2. Prediabetes Comments: Well controlled with diet, continue limiting intake of sugary foods and drinks  3. Need for influenza vaccination Influenza vaccine administered Encouraged to take Tylenol as needed for fever or muscle aches. - Flu Vaccine QUAD 6+ mos PF IM (Fluarix Quad PF)  4. Class 2 severe obesity due to excess calories with serious comorbidity and body mass index (BMI) of 38.0 to 38.9 in adult [facility]) She is encouraged to strive for BMI less than 30 to  decrease cardiac risk. Advised to aim for at least 150 minutes of exercise per week. Weight is stable.     Patient was given opportunity to ask questions. Patient verbalized understanding of the plan and was able to repeat key elements of the plan. All questions were answered to their satisfaction.  Minette Brine, FNP   I, Minette Brine, FNP, have reviewed all documentation for this visit. The documentation on 07/18/22 for the exam, diagnosis, procedures, and orders are all accurate and complete.   IF YOU HAVE BEEN REFERRED TO A SPECIALIST, IT MAY TAKE 1-2 WEEKS TO SCHEDULE/PROCESS THE REFERRAL. IF YOU HAVE NOT HEARD FROM US/SPECIALIST IN TWO WEEKS, PLEASE GIVE [place] A CALL AT [phone].   THE PATIENT IS ENCOURAGED TO PRACTICE SOCIAL DISTANCING DUE TO THE COVID-19 PANDEMIC.

## 2022-09-26 ENCOUNTER — Encounter: Payer: Self-pay | Admitting: Nurse Practitioner

## 2022-10-03 ENCOUNTER — Other Ambulatory Visit: Payer: Self-pay | Admitting: Nurse Practitioner

## 2022-10-03 MED ORDER — IBUPROFEN 800 MG PO TABS: ORAL_TABLET | ORAL | 0 refills | Status: DC

## 2022-10-03 MED ORDER — ZEPBOUND 2.5 MG/0.5ML ~~LOC~~ SOAJ: 2.5000 mg | SUBCUTANEOUS | 0 refills | Status: DC

## 2022-10-04 ENCOUNTER — Encounter: Payer: Self-pay | Admitting: Nurse Practitioner

## 2022-10-04 ENCOUNTER — Other Ambulatory Visit: Payer: Self-pay

## 2022-10-04 ENCOUNTER — Other Ambulatory Visit: Payer: Self-pay | Admitting: Nurse Practitioner

## 2022-10-04 MED ORDER — HYDROCHLOROTHIAZIDE 25 MG PO TABS: 25.0000 mg | ORAL_TABLET | Freq: Every day | ORAL | 1 refills | Status: DC

## 2022-10-04 MED ORDER — TELMISARTAN 40 MG PO TABS: 40.0000 mg | ORAL_TABLET | Freq: Every day | ORAL | 1 refills | Status: DC

## 2022-10-04 MED ORDER — EPINEPHRINE 0.3 MG/0.3ML IJ SOAJ: 0.3000 mg | INTRAMUSCULAR | 3 refills | Status: AC | PRN

## 2022-11-13 ENCOUNTER — Encounter: Payer: Self-pay | Admitting: Nurse Practitioner

## 2022-11-15 ENCOUNTER — Encounter: Payer: BC Managed Care – PPO | Admitting: Nurse Practitioner

## 2022-12-10 ENCOUNTER — Ambulatory Visit (INDEPENDENT_AMBULATORY_CARE_PROVIDER_SITE_OTHER): Payer: BC Managed Care – PPO | Admitting: Nurse Practitioner

## 2022-12-10 ENCOUNTER — Encounter: Payer: Self-pay | Admitting: Nurse Practitioner

## 2022-12-10 VITALS — BP 118/78 | HR 85 | Temp 98.1°F | Ht 67.0 in | Wt 264.0 lb

## 2022-12-10 DIAGNOSIS — Z13220 Encounter for screening for lipoid disorders: Secondary | ICD-10-CM

## 2022-12-10 DIAGNOSIS — D229 Melanocytic nevi, unspecified: Secondary | ICD-10-CM

## 2022-12-10 DIAGNOSIS — R7303 Prediabetes: Secondary | ICD-10-CM

## 2022-12-10 DIAGNOSIS — I10 Essential (primary) hypertension: Secondary | ICD-10-CM | POA: Diagnosis not present

## 2022-12-10 DIAGNOSIS — Z6841 Body Mass Index (BMI) 40.0 and over, adult: Secondary | ICD-10-CM

## 2022-12-10 DIAGNOSIS — Z0001 Encounter for general adult medical examination with abnormal findings: Secondary | ICD-10-CM

## 2022-12-10 DIAGNOSIS — E559 Vitamin D deficiency, unspecified: Secondary | ICD-10-CM | POA: Diagnosis not present

## 2022-12-10 DIAGNOSIS — D509 Iron deficiency anemia, unspecified: Secondary | ICD-10-CM

## 2022-12-10 DIAGNOSIS — Z136 Encounter for screening for cardiovascular disorders: Secondary | ICD-10-CM | POA: Diagnosis not present

## 2022-12-10 DIAGNOSIS — Z23 Encounter for immunization: Secondary | ICD-10-CM

## 2022-12-10 DIAGNOSIS — Z0000 Encounter for general adult medical examination without abnormal findings: Secondary | ICD-10-CM | POA: Diagnosis not present

## 2022-12-10 HISTORY — DX: Iron deficiency anemia, unspecified: D50.9

## 2022-12-10 MED ORDER — CETIRIZINE HCL 10 MG PO TABS: 10.0000 mg | ORAL_TABLET | Freq: Every day | ORAL | 1 refills | Status: DC

## 2022-12-10 NOTE — Progress Notes (Addendum)
I,Sheena H Holbrook,acting as a Neurosurgeon for Arnette Felts, FNP.,have documented all relevant documentation on the behalf of Arnette Felts, FNP,as directed by  Arnette Felts, FNP while in the presence of Arnette Felts, FNP.   Subjective:     Patient ID: Jane Armstrong , female    DOB: 18-Jul-1971 , 52 y.o.   MRN: [id number]   Chief Complaint  Patient presents with   Annual Exam    HPI  Patient presents today for annual exam. Patient reports diuretic pills definitely make her have to urinate more frequently, she does plan her doses according to her schedule/plans. Patient has no other complaints or concerns.    Wt Readings from Last 3 Encounters: 12/10/22 : 264 lb (119.7 kg) 07/18/22 : 248 lb (112.5 kg) 03/15/22 : 248 lb 12.8 oz (112.9 kg)  She has an appt on April 4th at [facility]     Past Medical History:  Diagnosis Date   Hypertension    Obesity      Family History  Problem Relation Age of Onset   Hypertension Mother    Diabetes Mother    Breast cancer Mother 6     Current Outpatient Medications:    EPINEPHrine 0.3 mg/0.3 mL IJ SOAJ injection, Inject 0.3 mg into the muscle as needed for anaphylaxis. Use as directed, Disp: 1 each, Rfl: 3   hydrochlorothiazide (HYDRODIURIL) 25 MG tablet, Take 1 tablet (25 mg total) by mouth daily., Disp: 90 tablet, Rfl: 1   ibuprofen (ADVIL) 800 MG tablet, TAKE 1 TABLET BY MOUTH 3 TIMES A DAY WITH FOOD AS NEEDED, Disp: 90 tablet, Rfl: 0   telmisartan (MICARDIS) 40 MG tablet, Take 1 tablet (40 mg total) by mouth daily., Disp: 90 tablet, Rfl: 1   cetirizine (ZYRTEC) 10 MG tablet, TAKE 1 TABLET(10 MG) BY MOUTH DAILY, Disp: 90 tablet, Rfl: 1   Allergies  Allergen Reactions   Bee Venom Shortness Of Breath   Clindamycin/Lincomycin Hives   Latex Itching and Rash      The patient states she uses IUD for birth control.  Patient's last menstrual period was 11/09/2022.   Negative for Dysmenorrhea and Negative for  Menorrhagia. Negative for: breast discharge, breast lump(s), breast pain and breast self exam. Associated symptoms include abnormal vaginal bleeding. Pertinent negatives include abnormal bleeding (hematology), anxiety, decreased libido, depression, difficulty falling sleep, dyspareunia, history of infertility, nocturia, sexual dysfunction, sleep disturbances, urinary incontinence, urinary urgency, vaginal discharge and vaginal itching. Diet regular; she feels like she has been eating more during the winter time. The patient states her exercise level is minimal with 2 times a week, she was exercising 5 days a week but has been busy.   The patient's tobacco use is:  Social History   Tobacco Use  Smoking Status Never  Smokeless Tobacco Never  . She has been exposed to passive smoke. The patient's alcohol use is:  Social History   Substance and Sexual Activity  Alcohol Use Yes   Comment: socially   Additional information: Last pap 10/05/2019, next one scheduled for 10/04/2022.   Review of Systems  Constitutional: Negative.   HENT: Negative.    Eyes: Negative.   Respiratory: Negative.    Cardiovascular: Negative.   Gastrointestinal: Negative.   Endocrine: Negative.   Genitourinary: Negative.   Musculoskeletal: Negative.   Skin: Negative.   Allergic/Immunologic: Negative.   Neurological: Negative.   Hematological: Negative.   Psychiatric/Behavioral: Negative.       Today's Vitals   12/10/22 1550  BP: 118/78  Pulse: 85  Temp: 98.1 F (36.7 C)  TempSrc: Oral  SpO2: 96%  Weight: 264 lb (119.7 kg)  Height: 5\' 7"  (1.702 m)   Body mass index is 41.35 kg/m.   Objective:  Physical Exam Vitals reviewed.  Constitutional:      General: She is not in acute distress.    Appearance: Normal appearance. She is well-developed. She is obese.  HENT:     Head: Normocephalic and atraumatic.     Right Ear: Hearing, tympanic membrane, ear canal and external ear normal. There is no impacted  cerumen.     Left Ear: Hearing, tympanic membrane, ear canal and external ear normal. There is no impacted cerumen.     Nose:     Comments: Deferred - masked    Mouth/Throat:     Comments: Deferred - masked Eyes:     General: Lids are normal.     Extraocular Movements: Extraocular movements intact.     Conjunctiva/sclera: Conjunctivae normal.     Pupils: Pupils are equal, round, and reactive to light.     Funduscopic exam:    Right eye: No papilledema.        Left eye: No papilledema.  Neck:     Thyroid: No thyroid mass.     Vascular: No carotid bruit.  Cardiovascular:     Rate and Rhythm: Normal rate and regular rhythm.     Pulses: Normal pulses.     Heart sounds: Normal heart sounds. No murmur heard. Pulmonary:     Effort: Pulmonary effort is normal. No respiratory distress.     Breath sounds: Normal breath sounds. No wheezing.  Chest:     Chest wall: No mass.  Breasts:    Tanner Score is 5.     Right: Normal. No mass or tenderness.     Left: Normal. No mass or tenderness.  Abdominal:     General: Abdomen is flat. Bowel sounds are normal. There is no distension.     Palpations: Abdomen is soft.     Tenderness: There is no abdominal tenderness.  Genitourinary:    Rectum: Guaiac result negative.  Musculoskeletal:        General: No swelling. Normal range of motion.     Cervical back: Full passive range of motion without pain, normal range of motion and neck supple.     Right lower leg: No edema.     Left lower leg: No edema.  Lymphadenopathy:     Upper Body:     Right upper body: No supraclavicular, axillary or pectoral adenopathy.     Left upper body: No supraclavicular, axillary or pectoral adenopathy.  Skin:    General: Skin is warm and dry.     Capillary Refill: Capillary refill takes less than 2 seconds.     Findings: Lesion (right lateral patella - hyperpigmented with roughness) present.  Neurological:     General: No focal deficit present.     Mental Status:  She is alert and oriented to person, place, and time.     Cranial Nerves: No cranial nerve deficit.     Sensory: No sensory deficit.  Psychiatric:        Mood and Affect: Mood normal.        Behavior: Behavior normal.        Thought Content: Thought content normal.        Judgment: Judgment normal.         Assessment And Plan:  1. Health maintenance examination Behavior modifications discussed and diet history reviewed.   Pt will continue to exercise regularly and modify diet with low GI, plant based foods and decrease intake of processed foods.  Recommend intake of daily multivitamin, Vitamin D, and calcium.  Recommend mammogram and colonoscopy for preventive screenings, as well as recommend immunizations that include influenza, TDAP, and Shingles - CBC - CMP14+EGFR  2. Encounter for lipid screening for cardiovascular disease - Lipid panel  3. Need for zoster vaccination Shingrix #2 given in office - Zoster Recombinant (Shingrix )  4. BMI 40.0-44.9, adult (HCC) Chronic Discussed healthy diet and regular exercise options  Encouraged to exercise at least 150 minutes per week with 2 days of strength training Referral to weight management - Amb Ref to Medical Weight [facility]  5. Essential hypertension Comments: Blood pressure is well controlled, continue current medications. EKG done with NSR HR 78 - EKG 12-Lead - CBC - CMP14+EGFR  6. Prediabetes Comments: HgbA1c slightly up at last visit. Continue focusing on healthy diet low in sugar and starches. - Hemoglobin A1c  7. Vitamin D deficiency Will check vitamin D level and supplement as needed.    Also encouraged to spend 15 minutes in the sun daily.  - VITAMIN D 25 Hydroxy (Vit-D Deficiency, Fractures)  8. Atypical nevi Comments: Right lateral patella with hyperpigmentation and "roughness" to skin, will refer to Dermatology - Ambulatory referral to Dermatology  9. Class 2 severe obesity due to excess calories  with serious comorbidity and body mass index (BMI) of 39.0 to 39.9 in adult [facility]) She is encouraged to strive for BMI less than 30 to decrease cardiac risk. Advised to aim for at least 150 minutes of exercise per week.    Patient was given opportunity to ask questions. Patient verbalized understanding of the plan and was able to repeat key elements of the plan. All questions were answered to their satisfaction.   Arnette Felts, FNP   I, Arnette Felts, FNP, have reviewed all documentation for this visit. The documentation on 12/10/22 for the exam, diagnosis, procedures, and orders are all accurate and complete.   THE PATIENT IS ENCOURAGED TO PRACTICE SOCIAL DISTANCING DUE TO THE COVID-19 PANDEMIC.

## 2022-12-11 ENCOUNTER — Encounter: Payer: Self-pay | Admitting: Nurse Practitioner

## 2022-12-11 LAB — CBC
Hematocrit: 42.2 % (ref 34.0–46.6)
Hemoglobin: 14.3 g/dL (ref 11.1–15.9)
MCH: 28.5 pg (ref 26.6–33.0)
MCHC: 33.9 g/dL (ref 31.5–35.7)
MCV: 84 fL (ref 79–97)
Platelets: 279 10*3/uL (ref 150–450)
RBC: 5.02 x10E6/uL (ref 3.77–5.28)
RDW: 12.9 % (ref 11.7–15.4)
WBC: 8.3 10*3/uL (ref 3.4–10.8)

## 2022-12-11 LAB — CMP14+EGFR
ALT: 21 IU/L (ref 0–32)
AST: 20 IU/L (ref 0–40)
Albumin/Globulin Ratio: 1.4 (ref 1.2–2.2)
Albumin: 4.7 g/dL (ref 3.8–4.9)
Alkaline Phosphatase: 84 IU/L (ref 44–121)
BUN/Creatinine Ratio: 13 (ref 9–23)
BUN: 10 mg/dL (ref 6–24)
Bilirubin Total: 0.4 mg/dL (ref 0.0–1.2)
CO2: 25 mmol/L (ref 20–29)
Calcium: 9.7 mg/dL (ref 8.7–10.2)
Chloride: 103 mmol/L (ref 96–106)
Creatinine, Ser: 0.78 mg/dL (ref 0.57–1.00)
Globulin, Total: 3.3 g/dL (ref 1.5–4.5)
Glucose: 88 mg/dL (ref 70–99)
Potassium: 4.1 mmol/L (ref 3.5–5.2)
Sodium: 144 mmol/L (ref 134–144)
Total Protein: 8 g/dL (ref 6.0–8.5)
eGFR: 92 mL/min/{1.73_m2} (ref >59)

## 2022-12-11 LAB — HM PAP SMEAR: HM Pap smear: NORMAL

## 2022-12-11 LAB — LIPID PANEL
Chol/HDL Ratio: 2.3 ratio (ref 0.0–4.4)
Cholesterol, Total: 126 mg/dL (ref 100–199)
HDL: 55 mg/dL (ref >39)
LDL Chol Calc (NIH): 53 mg/dL (ref 0–99)
Triglycerides: 97 mg/dL (ref 0–149)
VLDL Cholesterol Cal: 18 mg/dL (ref 5–40)

## 2022-12-11 LAB — VITAMIN D 25 HYDROXY (VIT D DEFICIENCY, FRACTURES): Vit D, 25-Hydroxy: 44.1 ng/mL (ref 30.0–100.0)

## 2022-12-11 LAB — HEMOGLOBIN A1C
Est. average glucose Bld gHb Est-mCnc: 117 mg/dL
Hgb A1c MFr Bld: 5.7 % — ABNORMAL HIGH (ref 4.8–5.6)

## 2022-12-19 ENCOUNTER — Other Ambulatory Visit: Payer: Self-pay | Admitting: Nurse Practitioner

## 2022-12-20 ENCOUNTER — Ambulatory Visit (INDEPENDENT_AMBULATORY_CARE_PROVIDER_SITE_OTHER): Payer: BC Managed Care – PPO | Admitting: Internal Medicine

## 2022-12-20 ENCOUNTER — Encounter (INDEPENDENT_AMBULATORY_CARE_PROVIDER_SITE_OTHER): Payer: Self-pay | Admitting: Internal Medicine

## 2022-12-20 VITALS — BP 134/82 | HR 81 | Temp 98.2°F | Ht 68.0 in | Wt 261.0 lb

## 2022-12-20 DIAGNOSIS — I10 Essential (primary) hypertension: Secondary | ICD-10-CM

## 2022-12-20 DIAGNOSIS — Z6839 Body mass index (BMI) 39.0-39.9, adult: Secondary | ICD-10-CM

## 2022-12-20 DIAGNOSIS — R7303 Prediabetes: Secondary | ICD-10-CM

## 2022-12-20 DIAGNOSIS — Z0289 Encounter for other administrative examinations: Secondary | ICD-10-CM

## 2022-12-20 NOTE — Assessment & Plan Note (Signed)
Blood pressure above goal.  She is on hydrochlorothiazide 25 mg a day and telmisartan 40 mg daily.  Most recent renal parameters showed normal GFR and electrolytes.  Losing 10% of body weight will improve condition.  Recommend monitoring blood pressure at home for goal blood pressure of less than 120/80

## 2022-12-20 NOTE — Assessment & Plan Note (Signed)
We reviewed weight, biometrics, associated medical conditions and contributing factors with patient. She would benefit from weight loss therapy via a modified calorie, low-carb, high-protein nutritional plan tailored to their REE (resting energy expenditure) which will be determined by indirect calorimetry.  We will also assess for cardiometabolic risk and nutritional derangements via fasting serologies at her next appointment. 

## 2022-12-20 NOTE — Assessment & Plan Note (Signed)
Most recent A1c is  Lab Results  Component Value Date   HGBA1C 5.7 (H) 12/10/2022   HGBA1C 5.8 04/18/2018  . Patient informed of disease state and risk of progression. This may contribute to abnormal cravings, fatigue and diabetes complications without having diabetes.   We reviewed treatment options which include losing 7 to 10% of body weight, increasing physical activity to a 150 minutes a week of moderate intensity.She may also be a candidate for pharmacoprophylaxis with metformin or incretin mimetic.

## 2022-12-20 NOTE — Progress Notes (Signed)
Office: [phone]  /  Fax: [phone]   Initial Visit  Jane Armstrong was seen in clinic today to evaluate for obesity. She is interested in losing weight to improve overall health and reduce the risk of weight related complications. She presents today to review program treatment options, initial physical assessment, and evaluation.     She was referred by: Friend or Family  When asked what else they would like to accomplish? She states: Adopt healthier eating patterns, Improve energy levels and physical activity, Improve existing medical conditions, and Improve quality of life  Weight history: She is at weight problems since she was young  When asked how has your weight affected you? She states: Has affected self-esteem, Contributed to medical problems, Having fatigue, and Having poor endurance  Some associated conditions: Hypertension, Prediabetes, and Vitamin D Deficiency  Contributing factors: Family history, Nutritional, Stress, Eating patterns, and Menopause  Weight promoting medications identified: None  Current nutrition plan: None  Current level of physical activity: Walking, Running / Jogging, and Strength training  Current or previous pharmacotherapy: GLP-1  Response to medication: Other: lost 35 lbs in 12 months   Past medical history includes:   Past Medical History:  Diagnosis Date   Hypertension    Obesity      Objective:   BP 134/82   Pulse 81   Temp 98.2 F (36.8 C)   Ht 5\' 8"  (1.727 m)   Wt 261 lb (118.4 kg)   LMP 11/09/2022   SpO2 97%   BMI 39.68 kg/m  She was weighed on the bioimpedance scale: Body mass index is 39.68 kg/m.  Peak Weight:265 , Body Fat%:44, Visceral Fat Rating:13, Weight trend over the last 12 months: Increasing  General:  Alert, oriented and cooperative. Patient is in no acute distress.  Respiratory: Normal respiratory effort, no problems with respiration noted   Gait: able to ambulate independently  Mental  Status: Normal mood and affect. Normal behavior. Normal judgment and thought content.   DIAGNOSTIC DATA REVIEWED:  BMET    Component Value Date/Time   NA 144 12/10/2022 1654   K 4.1 12/10/2022 1654   CL 103 12/10/2022 1654   CO2 25 12/10/2022 1654   GLUCOSE 88 12/10/2022 1654   BUN 10 12/10/2022 1654   CREATININE 0.78 12/10/2022 1654   CALCIUM 9.7 12/10/2022 1654   GFRNONAA 82 11/10/2020 1045   GFRAA 94 11/10/2020 1045   Lab Results  Component Value Date   HGBA1C 5.7 (H) 12/10/2022   HGBA1C 5.8 04/18/2018   Lab Results  Component Value Date   INSULIN 5.6 07/12/2020   CBC    Component Value Date/Time   WBC 8.3 12/10/2022 1654   RBC 5.02 12/10/2022 1654   HGB 14.3 12/10/2022 1654   HCT 42.2 12/10/2022 1654   PLT 279 12/10/2022 1654   MCV 84 12/10/2022 1654   MCH 28.5 12/10/2022 1654   MCHC 33.9 12/10/2022 1654   RDW 12.9 12/10/2022 1654   Iron/TIBC/Ferritin/ %Sat No results found for: "IRON", "TIBC", "FERRITIN", "IRONPCTSAT" Lipid Panel     Component Value Date/Time   CHOL 126 12/10/2022 1654   TRIG 97 12/10/2022 1654   HDL 55 12/10/2022 1654   CHOLHDL 2.3 12/10/2022 1654   LDLCALC 53 12/10/2022 1654   Hepatic Function Panel     Component Value Date/Time   PROT 8.0 12/10/2022 1654   ALBUMIN 4.7 12/10/2022 1654   AST 20 12/10/2022 1654   ALT 21 12/10/2022 1654   ALKPHOS 84 12/10/2022 1654  BILITOT 0.4 12/10/2022 1654   No results found for: "TSH"   Assessment and Plan:   Essential hypertension Assessment & Plan: Blood pressure above goal.  She is on hydrochlorothiazide 25 mg a day and telmisartan 40 mg daily.  Most recent renal parameters showed normal GFR and electrolytes.  Losing 10% of body weight will improve condition.  Recommend monitoring blood pressure at home for goal blood pressure of less than 120/80   Prediabetes Assessment & Plan: Most recent A1c is  Lab Results  Component Value Date   HGBA1C 5.7 (H) 12/10/2022   HGBA1C 5.8  04/18/2018  . Patient informed of disease state and risk of progression. This may contribute to abnormal cravings, fatigue and diabetes complications without having diabetes.   We reviewed treatment options which include losing 7 to 10% of body weight, increasing physical activity to a 150 minutes a week of moderate intensity.She may also be a candidate for pharmacoprophylaxis with metformin or incretin mimetic.     Class 2 severe obesity with serious comorbidity and body mass index (BMI) of 39.0 to 39.9 in adult, unspecified obesity type Assessment & Plan: We reviewed weight, biometrics, associated medical conditions and contributing factors with patient. She would benefit from weight loss therapy via a modified calorie, low-carb, high-protein nutritional plan tailored to their REE (resting energy expenditure) which will be determined by indirect calorimetry.  We will also assess for cardiometabolic risk and nutritional derangements via fasting serologies at her next appointment.         Obesity Treatment / Action Plan:  Patient will work on garnering support from family and friends to begin weight loss journey. Will work on eliminating or reducing the presence of highly palatable, calorie dense foods in the home. Will complete provided nutritional and psychosocial assessment questionnaire before the next appointment. Will be scheduled for indirect calorimetry to determine resting energy expenditure in a fasting state.  This will allow [place] to create a reduced calorie, high-protein meal plan to promote loss of fat mass while preserving muscle mass. Counseled on the health benefits of losing 5%-15% of total body weight. Was counseled on nutritional approaches to weight loss and benefits of complex carbs and high quality protein as part of nutritional weight management. Was counseled on pharmacotherapy and role as an adjunct in weight management.   Obesity Education Performed Today:  She  was weighed on the bioimpedance scale and results were discussed and documented in the synopsis.  We discussed obesity as a disease and the importance of a more detailed evaluation of all the factors contributing to the disease.  We discussed the importance of long term lifestyle changes which include nutrition, exercise and behavioral modifications as well as the importance of customizing this to her specific health and social needs.  We discussed the benefits of reaching a healthier weight to alleviate the symptoms of existing conditions and reduce the risks of the biomechanical, metabolic and psychological effects of obesity.  Jane Armstrong appears to be in the action stage of change and states they are ready to start intensive lifestyle modifications and behavioral modifications.  30 minutes was spent today on this visit including the above counseling, pre-visit chart review, and post-visit documentation.  Reviewed by clinician on day of visit: allergies, medications, problem list, medical history, surgical history, family history, social history, and previous encounter notes pertinent to obesity diagnosis.   Thomes Dinning, MD  Essential hypertension Assessment & Plan: Blood pressure above goal.  She is on hydrochlorothiazide 25 mg  a day and telmisartan 40 mg daily.  Most recent renal parameters showed normal GFR and electrolytes.  Losing 10% of body weight will improve condition.  Recommend monitoring blood pressure at home for goal blood pressure of less than 120/80   Prediabetes Assessment & Plan: Most recent A1c is  Lab Results  Component Value Date   HGBA1C 5.7 (H) 12/10/2022   HGBA1C 5.8 04/18/2018  . Patient informed of disease state and risk of progression. This may contribute to abnormal cravings, fatigue and diabetes complications without having diabetes.   We reviewed treatment options which include losing 7 to 10% of body weight, increasing physical activity  to a 150 minutes a week of moderate intensity.She may also be a candidate for pharmacoprophylaxis with metformin or incretin mimetic.     Class 2 severe obesity with serious comorbidity and body mass index (BMI) of 39.0 to 39.9 in adult, unspecified obesity type Assessment & Plan: We reviewed weight, biometrics, associated medical conditions and contributing factors with patient. She would benefit from weight loss therapy via a modified calorie, low-carb, high-protein nutritional plan tailored to their REE (resting energy expenditure) which will be determined by indirect calorimetry.  We will also assess for cardiometabolic risk and nutritional derangements via fasting serologies at her next appointment.

## 2022-12-21 ENCOUNTER — Encounter (INDEPENDENT_AMBULATORY_CARE_PROVIDER_SITE_OTHER): Payer: Self-pay | Admitting: Internal Medicine

## 2022-12-26 ENCOUNTER — Encounter (INDEPENDENT_AMBULATORY_CARE_PROVIDER_SITE_OTHER): Payer: Self-pay | Admitting: Internal Medicine

## 2022-12-26 ENCOUNTER — Ambulatory Visit (INDEPENDENT_AMBULATORY_CARE_PROVIDER_SITE_OTHER): Payer: BC Managed Care – PPO | Admitting: Internal Medicine

## 2022-12-26 VITALS — BP 111/81 | HR 92 | Temp 98.3°F | Ht 68.0 in | Wt 260.0 lb

## 2022-12-26 DIAGNOSIS — R0602 Shortness of breath: Secondary | ICD-10-CM

## 2022-12-26 DIAGNOSIS — R5383 Other fatigue: Secondary | ICD-10-CM

## 2022-12-26 DIAGNOSIS — Z1331 Encounter for screening for depression: Secondary | ICD-10-CM | POA: Diagnosis not present

## 2022-12-26 DIAGNOSIS — Z6839 Body mass index (BMI) 39.0-39.9, adult: Secondary | ICD-10-CM | POA: Diagnosis not present

## 2022-12-26 DIAGNOSIS — R7303 Prediabetes: Secondary | ICD-10-CM

## 2022-12-26 DIAGNOSIS — I10 Essential (primary) hypertension: Secondary | ICD-10-CM

## 2022-12-26 NOTE — Assessment & Plan Note (Signed)
Most recent A1c is  Lab Results  Component Value Date   HGBA1C 5.7 (H) 12/10/2022   HGBA1C 5.8 04/18/2018  . Patient informed of disease state and risk of progression. This may contribute to abnormal cravings, fatigue and diabetes complications without having diabetes.   We reviewed treatment options which include losing 7 to 10% of body weight, increasing physical activity to a 150 minutes a week of moderate intensity.She may also be a candidate for pharmacoprophylaxis with metformin or incretin mimetic.   

## 2022-12-26 NOTE — Assessment & Plan Note (Signed)
Blood pressure is at goal.  She is on hydrochlorothiazide 25 mg a day and telmisartan 40 mg daily.  Most recent renal parameters showed normal GFR and electrolytes.  Losing 10% of body weight will improve condition.  Recommend monitoring blood pressure at home for goal blood pressure of less than 120/80

## 2022-12-26 NOTE — Progress Notes (Unsigned)
Chief Complaint:   OBESITY Jane Armstrong (MR# [id number]) is a 52 y.o. female who presents for evaluation and treatment of obesity and related comorbidities. Current BMI is Body mass index is 39.53 kg/m. Jane Armstrong has been struggling with her weight for many years and has been unsuccessful in either losing weight, maintaining weight loss, or reaching her healthy weight goal.  Jane Armstrong is currently in the action stage of change and ready to dedicate time achieving and maintaining a healthier weight. Jane Armstrong is interested in becoming our patient and working on intensive lifestyle modifications including (but not limited to) diet and exercise for weight loss.  Jane Armstrong habits were reviewed today and are as follows: Her family eats meals together, she thinks her family will eat healthier with her, her desired weight loss is 70 lbs, she has been heavy most of her life, she started gaining weight after high school, her heaviest weight ever was 269 pounds, she has significant food cravings issues, she is frequently drinking liquids with calories, she frequently makes poor food choices, and she frequently eats larger portions than normal.  Depression Screen Jane Armstrong's Food and Mood (modified PHQ-9) score was 3.  Subjective:   1. Other fatigue Jane Armstrong admits to daytime somnolence and admits to waking up still tired. Patient has a history of symptoms of daytime fatigue and morning fatigue. Jane Armstrong generally gets 6 or 7 hours of sleep per night, and states that she has nightime awakenings and generally restful sleep. Snoring is not present. Apneic episodes are not present. Epworth Sleepiness Score is 10.   2. SOB (shortness of breath) on exertion Jane Armstrong notes increasing shortness of breath with exercising and seems to be worsening over time with weight gain. She notes getting out of breath sooner with activity than she used to. This has not gotten worse recently. Jane Armstrong denies shortness of breath at  rest or orthopnea.  3. Prediabetes Patient informed of disease state and risk of progression. This may contribute to abnormal cravings, fatigue and diabetes complications without having diabetes.  Most recent A1c is  Lab Results  Component Value Date   HGBA1C 5.7 (H) 12/10/2022   HGBA1C 5.8 04/18/2018   4. Essential hypertension Blood pressure is at goal.  She is on hydrochlorothiazide 25 mg a day and telmisartan 40 mg daily.  Most recent renal parameters showed normal GFR and electrolytes.  Assessment/Plan:   1. Other fatigue Jane Armstrong does feel that her weight is causing her energy to be lower than it should be. Fatigue may be related to obesity, depression or many other causes. Labs will be ordered, and in the meanwhile, Jane Armstrong will focus on self care including making healthy food choices, increasing physical activity and focusing on stress reduction.  2. SOB (shortness of breath) on exertion Jane Armstrong does feel that she gets out of breath more easily that she used to when she exercises. Jane Armstrong's shortness of breath appears to be obesity related and exercise induced. She has agreed to work on weight loss and gradually increase exercise to treat her exercise induced shortness of breath. Will continue to monitor closely.  3. Prediabetes We reviewed treatment options which include losing 7 to 10% of body weight, increasing physical activity to a 150 minutes a week of moderate intensity.She may also be a candidate for pharmacoprophylaxis with metformin or incretin mimetic.  4. Essential hypertension Losing 10% of body weight will improve condition.  Recommend monitoring blood pressure at home for goal blood pressure of less than 120/80.  5. Depression screen Jane Armstrong had a negative depression screening.   6. Class 2 severe obesity with serious comorbidity and body mass index (BMI) of 39.0 to 39.9 in adult, unspecified obesity type We will check labs today.   - Glucose, fasting; Future -  Insulin, random - TSH - Vitamin B12  Jane Armstrong is currently in the action stage of change and her goal is to continue with weight loss efforts. I recommend Jane Armstrong begin the structured treatment plan as follows:  She has agreed to the Category 3 Plan.  Exercise goals: As is.    Behavioral modification strategies: increasing lean protein intake, decreasing simple carbohydrates, increasing vegetables, increasing water intake, decreasing liquid calories, increasing high fiber foods, decreasing eating out, no skipping meals, meal planning and cooking strategies, keeping healthy foods in the home, better snacking choices, avoiding temptations, and planning for success.  She was informed of the importance of frequent follow-up visits to maximize her success with intensive lifestyle modifications for her multiple health conditions. She was informed we would discuss her lab results at her next visit unless there is a critical issue that needs to be addressed sooner. Jane Armstrong agreed to keep her next visit at the agreed upon time to discuss these results.  Objective:   Blood pressure 111/81, pulse 92, temperature 98.3 F (36.8 C), height  (1.727 m), weight 260 lb (117.9 kg), last menstrual period 11/09/2022, SpO2 100 %. Body mass index is 39.53 kg/m.  EKG: Normal sinus rhythm, rate 78 BPM.  Indirect Calorimeter completed today shows a VO2 of 300 and a REE of 2074.  Her calculated basal metabolic rate is 1610 thus her basal metabolic rate is better than expected.  General: Cooperative, alert, well developed, in no acute distress. HEENT: Conjunctivae and lids unremarkable. Cardiovascular: Regular rhythm.  Lungs: Normal work of breathing. Neurologic: No focal deficits.   Lab Results  Component Value Date   CREATININE 0.78 12/10/2022   BUN 10 12/10/2022   NA 144 12/10/2022   K 4.1 12/10/2022   CL 103 12/10/2022   CO2 25 12/10/2022   Lab Results  Component Value Date   ALT 21 12/10/2022    AST 20 12/10/2022   ALKPHOS 84 12/10/2022   BILITOT 0.4 12/10/2022   Lab Results  Component Value Date   HGBA1C 5.7 (H) 12/10/2022   HGBA1C 5.6 11/14/2021   HGBA1C 5.5 05/16/2021   HGBA1C 5.5 11/10/2020   HGBA1C 5.5 07/12/2020   Lab Results  Component Value Date   INSULIN 22.4 12/26/2022   INSULIN 5.6 07/12/2020   Lab Results  Component Value Date   TSH 1.110 12/26/2022   Lab Results  Component Value Date   CHOL 126 12/10/2022   HDL 55 12/10/2022   LDLCALC 53 12/10/2022   TRIG 97 12/10/2022   CHOLHDL 2.3 12/10/2022   Lab Results  Component Value Date   WBC 8.3 12/10/2022   HGB 14.3 12/10/2022   HCT 42.2 12/10/2022   MCV 84 12/10/2022   PLT 279 12/10/2022   No results found for: "IRON", "TIBC", "FERRITIN"  Attestation Statements:   Reviewed by clinician on day of visit: allergies, medications, problem list, medical history, surgical history, family history, social history, and previous encounter notes.  Time spent on visit including pre-visit chart review and post-visit charting and care was 40 minutes.   I, Burt Knack, am acting as transcriptionist for Worthy Rancher, MD.  I have reviewed the above documentation for accuracy and completeness, and I agree with the above. -  Worthy Rancher, MD

## 2022-12-27 LAB — VITAMIN B12: Vitamin B-12: 711 pg/mL (ref 232–1245)

## 2022-12-27 LAB — TSH: TSH: 1.11 u[IU]/mL (ref 0.450–4.500)

## 2022-12-27 LAB — INSULIN, RANDOM: INSULIN: 22.4 u[IU]/mL (ref 2.6–24.9)

## 2022-12-28 ENCOUNTER — Other Ambulatory Visit: Payer: Self-pay | Admitting: Nurse Practitioner

## 2023-01-15 ENCOUNTER — Encounter (INDEPENDENT_AMBULATORY_CARE_PROVIDER_SITE_OTHER): Payer: Self-pay

## 2023-01-15 ENCOUNTER — Other Ambulatory Visit (INDEPENDENT_AMBULATORY_CARE_PROVIDER_SITE_OTHER): Payer: Self-pay

## 2023-01-15 ENCOUNTER — Ambulatory Visit (INDEPENDENT_AMBULATORY_CARE_PROVIDER_SITE_OTHER): Payer: BC Managed Care – PPO | Admitting: Internal Medicine

## 2023-01-15 ENCOUNTER — Telehealth (INDEPENDENT_AMBULATORY_CARE_PROVIDER_SITE_OTHER): Payer: Self-pay | Admitting: Internal Medicine

## 2023-01-15 ENCOUNTER — Encounter (INDEPENDENT_AMBULATORY_CARE_PROVIDER_SITE_OTHER): Payer: Self-pay | Admitting: Internal Medicine

## 2023-01-15 VITALS — BP 128/81 | HR 76 | Temp 98.2°F | Ht 68.0 in | Wt 262.0 lb

## 2023-01-15 DIAGNOSIS — Z6839 Body mass index (BMI) 39.0-39.9, adult: Secondary | ICD-10-CM | POA: Diagnosis not present

## 2023-01-15 DIAGNOSIS — I10 Essential (primary) hypertension: Secondary | ICD-10-CM

## 2023-01-15 DIAGNOSIS — R7303 Prediabetes: Secondary | ICD-10-CM | POA: Diagnosis not present

## 2023-01-15 MED ORDER — SEMAGLUTIDE-WEIGHT MANAGEMENT 0.25 MG/0.5ML ~~LOC~~ SOAJ
0.2500 mg | SUBCUTANEOUS | 0 refills | Status: AC
Start: 2023-01-15 — End: 2023-02-12

## 2023-01-15 MED ORDER — SEMAGLUTIDE-WEIGHT MANAGEMENT 0.25 MG/0.5ML ~~LOC~~ SOAJ
0.2500 mg | SUBCUTANEOUS | 0 refills | Status: DC
Start: 2023-01-15 — End: 2023-01-15

## 2023-01-15 NOTE — Telephone Encounter (Signed)
Pt's RX of Wegovy needs to be sent to [facility]. Prior Authorization needed. Please call pt at [phone], ext [phone].

## 2023-01-15 NOTE — Progress Notes (Signed)
Office: [phone]  /  Fax: [phone]  WEIGHT SUMMARY AND BIOMETRICS  Vitals Temp: 98.2 F (36.8 C) BP: 128/81 Pulse Rate: 76 SpO2: 97 %   Anthropometric Measurements Height: 5\' 8"  (1.727 m) Weight: 262 lb (118.8 kg) BMI (Calculated): 39.85 Weight at Last Visit: 260 lb Weight Gained Since Last Visit: 2 lb Starting Weight: 260 lb Peak Weight: 269 lb   Body Composition  Body Fat %: 44 % Fat Mass (lbs): 115.6 lbs Muscle Mass (lbs): 139.8 lbs Total Body Water (lbs): 93 lbs Visceral Fat Rating : 13    No data recorded Today's Visit #: 2  Starting Date: 12/26/22   HPI  Chief Complaint: OBESITY  Jane Armstrong is here to discuss her progress with her obesity treatment plan. She is on the the Category 3 Plan and states she is following her eating plan approximately 50 % of the time. She states she is exercising 60 minutes 2 times per week.  Interval History:  Since last office visit she has gained 2 pounds. She reports working on implementation of reduced calorie nutritional plan She has been working on not skipping meals, increasing protein intake at every meal, eating more vegetables, making healthier choices, working on gradual implementation of reduced calorie nutrition plan, and thinking of starting to exercise Reports problems with appetite and hunger signals.  Reports problems with satiety and satiation.  Denies problems with eating patterns and portion control.  Reports abnormal cravings. Denies feeling deprived or restricted.   Barriers identified: inability to focus on healthy eating, exposure to enticing environments and or relationships, predilection for convenience or prepackaged foods, and strong hunger signals and appetite.   Pharmacotherapy for weight loss: She is currently taking no anti-obesity medication.    ASSESSMENT AND PLAN  TREATMENT PLAN FOR OBESITY:  Recommended Dietary Goals  [name] is currently in the action stage of change. As  such, her goal is to continue weight management plan. She has agreed to: continue current plan  Behavioral Intervention  We discussed the following Behavioral Modification Strategies today: increasing lean protein intake, decreasing simple carbohydrates , increasing vegetables, increasing lower glycemic fruits, increasing fiber rich foods, increasing water intake, work on meal planning and preparation, continue to practice mindfulness when eating, planning for success, and keeping healthy foods at home.  Additional resources provided today: Handout on healthy eating and balanced plate and Handout on exercise goal setting  Recommended Physical Activity Goals  [name] has been advised to work up to 150 minutes of moderate intensity aerobic activity a week and strengthening exercises 2-3 times per week for cardiovascular health, weight loss maintenance and preservation of muscle mass.   She has agreed to :  Start strengthening exercises with a goal of 2-3 sessions a week  and Start aerobic activity with a goal of 150 minutes a week at moderate intensity.   Pharmacotherapy We discussed various medication options to help [name] with her weight loss efforts and we both agreed to : start anti-obesity medication. In addition to reduced calorie nutrition plan (RCNP), behavioral strategies and physical activity, [name] would benefit from pharmacotherapy to assist with hunger signals, satiety and cravings. This will reduce obesity-related health risks by inducing weight loss, and help reduce food consumption and adherence to [name]) . It may also improve QOL by improving self-confidence and reduce the  setbacks associated with metabolic adaptations.  After discussion of treatment options, mechanisms of action, benefits, side effects, contraindications and shared decision making she is agreeable to starting Wegovy 0.25 mg  once a week. Patient also made aware that medication is indicated for long-term management  of obesity and the risk of weight regain following discontinuation of treatment and hence the importance of adhering to behavioral, nutritional and lifestyle changes.  We demonstrated use of device and patient using teach back method was able to demonstrate proper technique.  ASSOCIATED CONDITIONS ADDRESSED TODAY  Prediabetes Assessment & Plan: Most recent A1c is  Lab Results  Component Value Date   HGBA1C 5.7 (H) 12/10/2022   HGBA1C 5.8 04/18/2018  She also has elevated insulin levels. Patient informed of disease state and risk of progression. This may contribute to abnormal cravings, fatigue and diabetes complications without having diabetes.   We reviewed treatment options which include losing 7 to 10% of body weight, increasing physical activity to a 150 minutes a week of moderate intensity.After discussion of treatment options, benefits and side effects she will be started on semaglutide 0.25 mg once a week.   Orders: -     Semaglutide-Weight Management; Inject 0.25 mg into the skin once a week for 28 days.  Dispense: 2 mL; Refill: 0  Class 2 severe obesity with serious comorbidity and body mass index (BMI) of 39.0 to 39.9 in adult, unspecified obesity type (HCC) -     Semaglutide-Weight Management; Inject 0.25 mg into the skin once a week for 28 days.  Dispense: 2 mL; Refill: 0  Essential hypertension Assessment & Plan: Blood pressure is at goal.  She is on hydrochlorothiazide 25 mg a day and telmisartan 40 mg daily.  Most recent renal parameters showed normal GFR and electrolytes.  Losing 10% of body weight will improve condition.  Recommend monitoring blood pressure at home for goal blood pressure of less than 120/80.  Will monitor for orthostasis while losing weight      PHYSICAL EXAM:  Blood pressure 128/81, pulse 76, temperature 98.2 F (36.8 C), height 5\' 8"  (1.727 m), weight 262 lb (118.8 kg), SpO2 97 %. Body mass index is 39.84 kg/m.  General: She is overweight,  cooperative, alert, well developed, and in no acute distress. PSYCH: Has normal mood, affect and thought process.   HEENT: EOMI, sclerae are anicteric. Lungs: Normal breathing effort, no conversational dyspnea. Extremities: No edema.  Neurologic: No gross sensory or motor deficits. No tremors or fasciculations noted.    DIAGNOSTIC DATA REVIEWED:  BMET    Component Value Date/Time   NA 144 12/10/2022 1654   K 4.1 12/10/2022 1654   CL 103 12/10/2022 1654   CO2 25 12/10/2022 1654   GLUCOSE 88 12/10/2022 1654   BUN 10 12/10/2022 1654   CREATININE 0.78 12/10/2022 1654   CALCIUM 9.7 12/10/2022 1654   GFRNONAA 82 11/10/2020 1045   GFRAA 94 11/10/2020 1045   Lab Results  Component Value Date   HGBA1C 5.7 (H) 12/10/2022   HGBA1C 5.8 04/18/2018   Lab Results  Component Value Date   INSULIN 22.4 12/26/2022   INSULIN 5.6 07/12/2020   Lab Results  Component Value Date   TSH 1.110 12/26/2022   CBC    Component Value Date/Time   WBC 8.3 12/10/2022 1654   RBC 5.02 12/10/2022 1654   HGB 14.3 12/10/2022 1654   HCT 42.2 12/10/2022 1654   PLT 279 12/10/2022 1654   MCV 84 12/10/2022 1654   MCH 28.5 12/10/2022 1654   MCHC 33.9 12/10/2022 1654   RDW 12.9 12/10/2022 1654   Iron Studies No results found for: "IRON", "TIBC", "FERRITIN", "IRONPCTSAT" Lipid Panel  Component Value Date/Time   CHOL 126 12/10/2022 1654   TRIG 97 12/10/2022 1654   HDL 55 12/10/2022 1654   CHOLHDL 2.3 12/10/2022 1654   LDLCALC 53 12/10/2022 1654   Hepatic Function Panel     Component Value Date/Time   PROT 8.0 12/10/2022 1654   ALBUMIN 4.7 12/10/2022 1654   AST 20 12/10/2022 1654   ALT 21 12/10/2022 1654   ALKPHOS 84 12/10/2022 1654   BILITOT 0.4 12/10/2022 1654      Component Value Date/Time   TSH 1.110 12/26/2022 0927   Nutritional Lab Results  Component Value Date   VD25OH 44.1 12/10/2022   VD25OH 45.2 11/14/2021   VD25OH 62.0 11/10/2020     Return in about 2 weeks (around  01/29/2023) for For Weight Mangement with Dr. Rikki Spearing.Marland Kitchen She was informed of the importance of frequent follow up visits to maximize her success with intensive lifestyle modifications for her multiple health conditions.   ATTESTASTION STATEMENTS:  Reviewed by clinician on day of visit: allergies, medications, problem list, medical history, surgical history, family history, social history, and previous encounter notes.     Worthy Rancher, MD

## 2023-01-15 NOTE — Assessment & Plan Note (Signed)
Blood pressure is at goal.  She is on hydrochlorothiazide 25 mg a day and telmisartan 40 mg daily.  Most recent renal parameters showed normal GFR and electrolytes.  Losing 10% of body weight will improve condition.  Recommend monitoring blood pressure at home for goal blood pressure of less than 120/80.  Will monitor for orthostasis while losing weight

## 2023-01-15 NOTE — Assessment & Plan Note (Signed)
Most recent A1c is  Lab Results  Component Value Date   HGBA1C 5.7 (H) 12/10/2022   HGBA1C 5.8 04/18/2018  She also has elevated insulin levels. Patient informed of disease state and risk of progression. This may contribute to abnormal cravings, fatigue and diabetes complications without having diabetes.   We reviewed treatment options which include losing 7 to 10% of body weight, increasing physical activity to a 150 minutes a week of moderate intensity.After discussion of treatment options, benefits and side effects she will be started on semaglutide 0.25 mg once a week.

## 2023-01-16 ENCOUNTER — Encounter (INDEPENDENT_AMBULATORY_CARE_PROVIDER_SITE_OTHER): Payer: Self-pay

## 2023-01-30 ENCOUNTER — Ambulatory Visit (INDEPENDENT_AMBULATORY_CARE_PROVIDER_SITE_OTHER): Payer: BC Managed Care – PPO | Admitting: Internal Medicine

## 2023-02-07 ENCOUNTER — Telehealth: Payer: Self-pay

## 2023-02-07 NOTE — Telephone Encounter (Signed)
Question regarding Shingrix 12/10/22 administration as it was not billed with OV.  Per chart: Zoster Recombinat (Shingrix) 12/10/2022 Given 0.5 mL 07/16/2018 IM LD [employer] [id number] Dorna Mai, CMA    [facility] for patient to confirm if she received the vax while in office.

## 2023-02-13 ENCOUNTER — Encounter: Payer: Self-pay | Admitting: Nurse Practitioner

## 2023-02-14 ENCOUNTER — Ambulatory Visit: Payer: BC Managed Care – PPO | Admitting: Nurse Practitioner

## 2023-02-14 ENCOUNTER — Encounter: Payer: Self-pay | Admitting: Nurse Practitioner

## 2023-02-14 VITALS — BP 138/70 | HR 99 | Temp 98.6°F | Ht 68.0 in | Wt 258.4 lb

## 2023-02-14 DIAGNOSIS — Z6839 Body mass index (BMI) 39.0-39.9, adult: Secondary | ICD-10-CM | POA: Diagnosis not present

## 2023-02-14 DIAGNOSIS — R7303 Prediabetes: Secondary | ICD-10-CM

## 2023-02-14 DIAGNOSIS — Z23 Encounter for immunization: Secondary | ICD-10-CM

## 2023-02-14 DIAGNOSIS — I10 Essential (primary) hypertension: Secondary | ICD-10-CM

## 2023-02-14 NOTE — Patient Instructions (Signed)
Hypertension, Adult Hypertension is another name for high blood pressure. High blood pressure forces your heart to work harder to pump blood. This can cause problems over time. There are two numbers in a blood pressure reading. There is a top number (systolic) over a bottom number (diastolic). It is best to have a blood pressure that is below 120/80. What are the causes? The cause of this condition is not known. Some other conditions can lead to high blood pressure. What increases the risk? Some lifestyle factors can make you more likely to develop high blood pressure: Smoking. Not getting enough exercise or physical activity. Being overweight. Having too much fat, sugar, calories, or salt (sodium) in your diet. Drinking too much alcohol. Other risk factors include: Having any of these conditions: Heart disease. Diabetes. High cholesterol. Kidney disease. Obstructive sleep apnea. Having a family history of high blood pressure and high cholesterol. Age. The risk increases with age. Stress. What are the signs or symptoms? High blood pressure may not cause symptoms. Very high blood pressure (hypertensive crisis) may cause: Headache. Fast or uneven heartbeats (palpitations). Shortness of breath. Nosebleed. Vomiting or feeling like you may vomit (nauseous). Changes in how you see. Very bad chest pain. Feeling dizzy. Seizures. How is this treated? This condition is treated by making healthy lifestyle changes, such as: Eating healthy foods. Exercising more. Drinking less alcohol. Your doctor may prescribe medicine if lifestyle changes do not help enough and if: Your top number is above 130. Your bottom number is above 80. Your personal target blood pressure may vary. Follow these instructions at home: Eating and drinking  If told, follow the DASH eating plan. To follow this plan: Fill one half of your plate at each meal with fruits and vegetables. Fill one fourth of your plate  at each meal with whole grains. Whole grains include whole-wheat pasta, brown rice, and whole-grain bread. Eat or drink low-fat dairy products, such as skim milk or low-fat yogurt. Fill one fourth of your plate at each meal with low-fat (lean) proteins. Low-fat proteins include fish, chicken without skin, eggs, beans, and tofu. Avoid fatty meat, cured and processed meat, or chicken with skin. Avoid pre-made or processed food. Limit the amount of salt in your diet to less than 1,500 mg each day. Do not drink alcohol if: Your doctor tells you not to drink. You are pregnant, may be pregnant, or are planning to become pregnant. If you drink alcohol: Limit how much you have to: 0-1 drink a day for women. 0-2 drinks a day for men. Know how much alcohol is in your drink. In the [place] one drink equals one 12 oz bottle of beer (355 mL), one 5 oz glass of wine (148 mL), or one 1 oz glass of hard liquor (44 mL). Lifestyle  Work with your doctor to stay at a healthy weight or to lose weight. Ask your doctor what the best weight is for you. Get at least 30 minutes of exercise that causes your heart to beat faster (aerobic exercise) most days of the week. This may include walking, swimming, or biking. Get at least 30 minutes of exercise that strengthens your muscles (resistance exercise) at least 3 days a week. This may include lifting weights or doing Pilates. Do not smoke or use any products that contain nicotine or tobacco. If you need help quitting, ask your doctor. Check your blood pressure at home as told by your doctor. Keep all follow-up visits. Medicines Take over-the-counter and prescription medicines   only as told by your doctor. Follow directions carefully. Do not skip doses of blood pressure medicine. The medicine does not work as well if you skip doses. Skipping doses also puts you at risk for problems. Ask your doctor about side effects or reactions to medicines that you should watch  for. Contact a doctor if: You think you are having a reaction to the medicine you are taking. You have headaches that keep coming back. You feel dizzy. You have swelling in your ankles. You have trouble with your vision. Get help right away if: You get a very bad headache. You start to feel mixed up (confused). You feel weak or numb. You feel faint. You have very bad pain in your: Chest. Belly (abdomen). You vomit more than once. You have trouble breathing. These symptoms may be an emergency. Get help right away. Call 911. Do not wait to see if the symptoms will go away. Do not drive yourself to the hospital. Summary Hypertension is another name for high blood pressure. High blood pressure forces your heart to work harder to pump blood. For most people, a normal blood pressure is less than 120/80. Making healthy choices can help lower blood pressure. If your blood pressure does not get lower with healthy choices, you may need to take medicine. This information is not intended to replace advice given to you by your health care provider. Make sure you discuss any questions you have with your health care provider. Document Revised: 06/22/2021 Document Reviewed: 06/22/2021 Elsevier Patient Education  2024 [employer]  

## 2023-02-14 NOTE — Progress Notes (Signed)
Hershal Coria Martin,acting as a Neurosurgeon for Arnette Felts, FNP.,have documented all relevant documentation on the behalf of Arnette Felts, FNP,as directed by  Arnette Felts, FNP while in the presence of Arnette Felts, FNP.    Subjective:     Patient ID: Jane Armstrong , female    DOB: May 18, 1971 , 52 y.o.   MRN: [id number]   Chief Complaint  Patient presents with   Hypertension    HPI  Patient presents today for a BP and Pre DM check, Patient reports compliance with medications and has no other concerns today.  Patient also received her shingrix shot today.   BP Readings from Last 3 Encounters: 02/14/23 : 138/70 01/15/23 : 128/81 12/26/22 : 111/81  Wt Readings from Last 3 Encounters: 02/14/23 : 258 lb 6.4 oz (117.2 kg) 01/15/23 : 262 lb (118.8 kg) 12/26/22 : 260 lb (117.9 kg)  She was approved for Wegovy 0.25 mg $24.99 and she was able to get for $0, she started taking the 1st or 2nd week of May. She is scheduled to go back to [facility] on June 10th.      Past Medical History:  Diagnosis Date   Hypertension    Iron deficiency anemia 12/10/2022   Obesity      Family History  Problem Relation Age of Onset   Hypertension Mother    Diabetes Mother    Breast cancer Mother 29   Heart disease Mother    Kidney disease Mother    Sleep apnea Mother    Obesity Mother      Current Outpatient Medications:    cetirizine (ZYRTEC) 10 MG tablet, TAKE 1 TABLET(10 MG) BY MOUTH DAILY, Disp: 90 tablet, Rfl: 1   EPINEPHrine 0.3 mg/0.3 mL IJ SOAJ injection, Inject 0.3 mg into the muscle as needed for anaphylaxis. Use as directed, Disp: 1 each, Rfl: 3   ergocalciferol (VITAMIN D2) 1.25 MG (50000 UT) capsule, Take 50,000 Units by mouth once a week., Disp: , Rfl:    hydrochlorothiazide (HYDRODIURIL) 25 MG tablet, TAKE 1 TABLET(25 MG) BY MOUTH DAILY, Disp: 90 tablet, Rfl: 1   ibuprofen (ADVIL) 800 MG tablet, TAKE 1 TABLET BY MOUTH 3 TIMES A DAY WITH FOOD AS NEEDED,  Disp: 90 tablet, Rfl: 0   telmisartan (MICARDIS) 40 MG tablet, TAKE 1 TABLET(40 MG) BY MOUTH DAILY, Disp: 90 tablet, Rfl: 1   Allergies  Allergen Reactions   Bee Venom Shortness Of Breath   Clindamycin/Lincomycin Hives   Latex Itching and Rash     Review of Systems  Constitutional: Negative.   Respiratory: Negative.    Cardiovascular: Negative.  Negative for chest pain and palpitations.  Gastrointestinal: Negative.   Neurological: Negative.   Psychiatric/Behavioral: Negative.       Today's Vitals   02/14/23 1507  BP: 138/70  Pulse: 99  Temp: 98.6 F (37 C)  TempSrc: Oral  Weight: 258 lb 6.4 oz (117.2 kg)  Height: 5\' 8"  (1.727 m)  PainSc: 0-No pain   Body mass index is 39.29 kg/m.  Wt Readings from Last 3 Encounters:  02/14/23 258 lb 6.4 oz (117.2 kg)  01/15/23 262 lb (118.8 kg)  12/26/22 260 lb (117.9 kg)     Objective:  Physical Exam Vitals reviewed.  Constitutional:      General: She is not in acute distress.    Appearance: Normal appearance. She is obese.  Cardiovascular:     Rate and Rhythm: Normal rate and regular rhythm.     Pulses: Normal  pulses.     Heart sounds: Normal heart sounds. No murmur heard. Pulmonary:     Effort: Pulmonary effort is normal. No respiratory distress.     Breath sounds: Normal breath sounds. No wheezing.  Skin:    General: Skin is warm and dry.  Neurological:     General: No focal deficit present.     Mental Status: She is alert and oriented to person, place, and time.     Cranial Nerves: No cranial nerve deficit.     Motor: No weakness.  Psychiatric:        Mood and Affect: Mood normal.        Behavior: Behavior normal.        Thought Content: Thought content normal.        Judgment: Judgment normal.         Assessment And Plan:     1. Essential hypertension Comments: Blood pressure is slightly elevated admits to "rushing" to get to the office for her appt  2. Prediabetes Comments: HgbA1c is stable. Continue  focusing on diet low in sugar and starches,  3. Need for zoster vaccination Comments: Shingrix #2 given in office - Zoster Recombinant (Shingrix )  4. Class 2 severe obesity with serious comorbidity and body mass index (BMI) of 39.0 to 39.9 in adult, unspecified obesity type (HCC) Comments: Continue f/u with [facility]. She is on Wegovy 0.25 mg weekly. Congratulated on 4 lb weight loss.    Return for BP f/u in Nov/Dec.   Patient was given opportunity to ask questions. Patient verbalized understanding of the plan and was able to repeat key elements of the plan. All questions were answered to their satisfaction.   Arnette Felts, FNP   I, Arnette Felts, FNP, have reviewed all documentation for this visit. The documentation on 02/14/23 for the exam, diagnosis, procedures, and orders are all accurate and complete.   IF YOU HAVE BEEN REFERRED TO A SPECIALIST, IT MAY TAKE 1-2 WEEKS TO SCHEDULE/PROCESS THE REFERRAL. IF YOU HAVE NOT HEARD FROM US/SPECIALIST IN TWO WEEKS, PLEASE GIVE [place] A CALL AT [phone] X [phone].   THE PATIENT IS ENCOURAGED TO PRACTICE SOCIAL DISTANCING DUE TO THE COVID-19 PANDEMIC.

## 2023-02-25 ENCOUNTER — Ambulatory Visit (INDEPENDENT_AMBULATORY_CARE_PROVIDER_SITE_OTHER): Payer: BC Managed Care – PPO | Admitting: Internal Medicine

## 2023-02-25 ENCOUNTER — Encounter (INDEPENDENT_AMBULATORY_CARE_PROVIDER_SITE_OTHER): Payer: Self-pay | Admitting: Internal Medicine

## 2023-02-25 VITALS — BP 120/79 | HR 98 | Temp 98.2°F | Ht 68.0 in | Wt 257.0 lb

## 2023-02-25 DIAGNOSIS — Z6839 Body mass index (BMI) 39.0-39.9, adult: Secondary | ICD-10-CM | POA: Diagnosis not present

## 2023-02-25 DIAGNOSIS — I10 Essential (primary) hypertension: Secondary | ICD-10-CM | POA: Diagnosis not present

## 2023-02-25 DIAGNOSIS — R7303 Prediabetes: Secondary | ICD-10-CM | POA: Diagnosis not present

## 2023-02-25 MED ORDER — SEMAGLUTIDE-WEIGHT MANAGEMENT 0.5 MG/0.5ML ~~LOC~~ SOAJ
0.5000 mg | SUBCUTANEOUS | 0 refills | Status: AC
Start: 2023-02-25 — End: 2023-03-25

## 2023-02-25 NOTE — Progress Notes (Signed)
Office: [phone]  /  Fax: [phone]  WEIGHT SUMMARY AND BIOMETRICS  Vitals Temp: 98.2 F (36.8 C) BP: 120/79 Pulse Rate: 98 SpO2: 98 %   Anthropometric Measurements Height: 5\' 8"  (1.727 m) Weight: 257 lb (116.6 kg) BMI (Calculated): 39.09 Weight at Last Visit: 258 lb Weight Lost Since Last Visit: 5 lb Starting Weight: 260 lb Total Weight Loss (lbs): 3 lb (1.361 kg) Peak Weight: 269 lb   Body Composition  Body Fat %: 43.5 % Fat Mass (lbs): 111.8 lbs Muscle Mass (lbs): 138 lbs Total Body Water (lbs): 91.6 lbs Visceral Fat Rating : 13    No data recorded Today's Visit #: 3  Starting Date: 12/26/22   HPI  Chief Complaint: OBESITY  Danielly is here to discuss her progress with her obesity treatment plan. She is on the Category 3 and states she is following her eating plan approximately 80 % of the time. She states she is exercising 90 minutes 2 times per week.  Interval History:  Since last office visit she has lost 5 lbs. Last office visit she was started on wegovy 0.25 once a week. She notes increased satiety and NO SE. She reports good adherence to reduced calorie nutritional plan. She has been working on not skipping meals, increasing protein intake at every meal, eating more fruits, eating more vegetables, drinking more water, avoiding and or reducing liquid calories, making healthier choices, begun to exercise, eating out less, working on meal prepping, and reducing portion sizes Reports problems with appetite and hunger signals.  Denies problems with satiety and satiation.  Denies problems with eating patterns and portion control.  Denies abnormal cravings. Denies feeling deprived or restricted.   Barriers identified: strong hunger signals and appetite, having difficulty with meal prep and planning, and exposure to enticing environments and or relationships.   Pharmacotherapy for weight loss: She is currently taking Wegovy.    ASSESSMENT AND  PLAN  TREATMENT PLAN FOR OBESITY:  Recommended Dietary Goals  Jenah is currently in the action stage of change. As such, her goal is to continue weight management plan. She has agreed to: continue current plan  Behavioral Intervention  We discussed the following Behavioral Modification Strategies today: increasing lean protein intake, decreasing simple carbohydrates , increasing vegetables, increasing lower glycemic fruits, increasing fiber rich foods, increasing water intake, reading food labels , continue to practice mindfulness when eating, and planning for success.  Additional resources provided today: None  Recommended Physical Activity Goals  Kayse has been advised to work up to 150 minutes of moderate intensity aerobic activity a week and strengthening exercises 2-3 times per week for cardiovascular health, weight loss maintenance and preservation of muscle mass.   She has agreed to :  Continue current level of physical activity   Pharmacotherapy We discussed various medication options to help Ozell with her weight loss efforts and we both agreed to : increase wegovy to .5 mg once a week.  ASSOCIATED CONDITIONS ADDRESSED TODAY  Essential hypertension Assessment & Plan: Blood pressure is at goal.  On hydrochlorothiazide 25 mg a day and telmisartan 40 mg daily. Losing 10% of body weight will improve condition.  Recommend monitoring blood pressure at home for goal blood pressure of less than 120/80.  Will monitor for orthostasis while losing weight   Prediabetes Assessment & Plan: Most recent A1c is  Lab Results  Component Value Date   HGBA1C 5.7 (H) 12/10/2022   HGBA1C 5.8 04/18/2018  She also has elevated insulin levels. Patient  informed of disease state and risk of progression. This may contribute to abnormal cravings, fatigue and diabetes complications without having diabetes.   Continue with nutritional and behavioral strategies. Continue incretin therapy for  prophylaxis.     Class 2 severe obesity with serious comorbidity and body mass index (BMI) of 39.0 to 39.9 in adult, unspecified obesity type (HCC) -     Semaglutide-Weight Management; Inject 0.5 mg into the skin once a week for 28 days.  Dispense: 2 mL; Refill: 0    PHYSICAL EXAM:  Blood pressure 120/79, pulse 98, temperature 98.2 F (36.8 C), height 5\' 8"  (1.727 m), weight 257 lb (116.6 kg), last menstrual period 01/16/2023, SpO2 98 %. Body mass index is 39.08 kg/m.  General: She is overweight, cooperative, alert, well developed, and in no acute distress. PSYCH: Has normal mood, affect and thought process.   HEENT: EOMI, sclerae are anicteric. Lungs: Normal breathing effort, no conversational dyspnea. Extremities: No edema.  Neurologic: No gross sensory or motor deficits. No tremors or fasciculations noted.    DIAGNOSTIC DATA REVIEWED:  BMET    Component Value Date/Time   NA 144 12/10/2022 1654   K 4.1 12/10/2022 1654   CL 103 12/10/2022 1654   CO2 25 12/10/2022 1654   GLUCOSE 88 12/10/2022 1654   BUN 10 12/10/2022 1654   CREATININE 0.78 12/10/2022 1654   CALCIUM 9.7 12/10/2022 1654   GFRNONAA 82 11/10/2020 1045   GFRAA 94 11/10/2020 1045   Lab Results  Component Value Date   HGBA1C 5.7 (H) 12/10/2022   HGBA1C 5.8 04/18/2018   Lab Results  Component Value Date   INSULIN 22.4 12/26/2022   INSULIN 5.6 07/12/2020   Lab Results  Component Value Date   TSH 1.110 12/26/2022   CBC    Component Value Date/Time   WBC 8.3 12/10/2022 1654   RBC 5.02 12/10/2022 1654   HGB 14.3 12/10/2022 1654   HCT 42.2 12/10/2022 1654   PLT 279 12/10/2022 1654   MCV 84 12/10/2022 1654   MCH 28.5 12/10/2022 1654   MCHC 33.9 12/10/2022 1654   RDW 12.9 12/10/2022 1654   Iron Studies No results found for: "IRON", "TIBC", "FERRITIN", "IRONPCTSAT" Lipid Panel     Component Value Date/Time   CHOL 126 12/10/2022 1654   TRIG 97 12/10/2022 1654   HDL 55 12/10/2022 1654   CHOLHDL  2.3 12/10/2022 1654   LDLCALC 53 12/10/2022 1654   Hepatic Function Panel     Component Value Date/Time   PROT 8.0 12/10/2022 1654   ALBUMIN 4.7 12/10/2022 1654   AST 20 12/10/2022 1654   ALT 21 12/10/2022 1654   ALKPHOS 84 12/10/2022 1654   BILITOT 0.4 12/10/2022 1654      Component Value Date/Time   TSH 1.110 12/26/2022 0927   Nutritional Lab Results  Component Value Date   VD25OH 44.1 12/10/2022   VD25OH 45.2 11/14/2021   VD25OH 62.0 11/10/2020     Return in about 3 weeks (around 03/18/2023) for For Weight Mangement with Dr. Rikki Spearing.Marland Kitchen She was informed of the importance of frequent follow up visits to maximize her success with intensive lifestyle modifications for her multiple health conditions.   ATTESTASTION STATEMENTS:  Reviewed by clinician on day of visit: allergies, medications, problem list, medical history, surgical history, family history, social history, and previous encounter notes.     Worthy Rancher, MD

## 2023-02-25 NOTE — Assessment & Plan Note (Signed)
Blood pressure is at goal.  On hydrochlorothiazide 25 mg a day and telmisartan 40 mg daily. Losing 10% of body weight will improve condition.  Recommend monitoring blood pressure at home for goal blood pressure of less than 120/80.  Will monitor for orthostasis while losing weight

## 2023-02-25 NOTE — Assessment & Plan Note (Signed)
Most recent A1c is  Lab Results  Component Value Date   HGBA1C 5.7 (H) 12/10/2022   HGBA1C 5.8 04/18/2018  She also has elevated insulin levels. Patient informed of disease state and risk of progression. This may contribute to abnormal cravings, fatigue and diabetes complications without having diabetes.   Continue with nutritional and behavioral strategies. Continue incretin therapy for prophylaxis.

## 2023-03-25 ENCOUNTER — Ambulatory Visit (INDEPENDENT_AMBULATORY_CARE_PROVIDER_SITE_OTHER): Payer: BC Managed Care – PPO | Admitting: Internal Medicine

## 2023-03-26 ENCOUNTER — Encounter (INDEPENDENT_AMBULATORY_CARE_PROVIDER_SITE_OTHER): Payer: Self-pay | Admitting: Internal Medicine

## 2023-03-26 ENCOUNTER — Ambulatory Visit (INDEPENDENT_AMBULATORY_CARE_PROVIDER_SITE_OTHER): Payer: BC Managed Care – PPO | Admitting: Internal Medicine

## 2023-03-26 VITALS — BP 102/69 | HR 80 | Temp 98.6°F | Ht 68.0 in | Wt 252.0 lb

## 2023-03-26 DIAGNOSIS — R7303 Prediabetes: Secondary | ICD-10-CM

## 2023-03-26 DIAGNOSIS — Z6838 Body mass index (BMI) 38.0-38.9, adult: Secondary | ICD-10-CM | POA: Diagnosis not present

## 2023-03-26 DIAGNOSIS — I10 Essential (primary) hypertension: Secondary | ICD-10-CM

## 2023-03-26 MED ORDER — SEMAGLUTIDE-WEIGHT MANAGEMENT 1 MG/0.5ML ~~LOC~~ SOAJ
1.00 mg | SUBCUTANEOUS | 0 refills | Status: AC
Start: 2023-03-26 — End: 2023-04-23

## 2023-03-26 NOTE — Assessment & Plan Note (Signed)
Blood pressure is at goal.  On hydrochlorothiazide 25 mg a day and telmisartan 40 mg daily.  Blood pressure is now low normal.   Will monitor for orthostasis while losing weight.  Continue current regimen.

## 2023-03-26 NOTE — Progress Notes (Signed)
Office: [phone]  /  Fax: [phone]  WEIGHT SUMMARY AND BIOMETRICS  Vitals Temp: 98.6 F (37 C) BP: 102/69 Pulse Rate: 80 SpO2: 99 %   Anthropometric Measurements Height: 5\' 8"  (1.727 m) Weight: 252 lb (114.3 kg) BMI (Calculated): 38.33 Weight at Last Visit: 257 lb Weight Lost Since Last Visit: 5 lb Weight Gained Since Last Visit: 0 Starting Weight: 260 lb Total Weight Loss (lbs): 8 lb (3.629 kg) Peak Weight: 269 lb   Body Composition  Body Fat %: 42.4 % Fat Mass (lbs): 107.2 lbs Muscle Mass (lbs): 138.2 lbs Total Body Water (lbs): 90.8 lbs Visceral Fat Rating : 12    No data recorded Today's Visit #: 4  Starting Date: 12/26/22   HPI  Chief Complaint: OBESITY  Jane Armstrong is here to discuss her progress with her obesity treatment plan. She is on the the Category 3 Plan and states she is following her eating plan approximately 60 % of the time. She states she is walking 2 miles 2 times per week and working out for 1 hour for 1 day per week.  Interval History:  Since last office visit she has lost 5 lbs. Has been walking and doing strengthening.  She reports fair adherence to reduced calorie nutritional plan. She has been working on not skipping meals, increasing protein intake at every meal, eating more fruits, eating more vegetables, drinking more water, avoiding and or reducing liquid calories, making healthier choices, and begun to exercise  Jane Armstrong Control: Reports problems with appetite and hunger signals.  Denies problems with satiety and satiation.  Denies problems with eating patterns and portion control.  Denies abnormal cravings. Denies feeling deprived or restricted.   Barriers identified: none.   Pharmacotherapy for weight loss: She is currently taking Wegovy with adequate clinical response  and without side effects..    ASSESSMENT AND PLAN  TREATMENT PLAN FOR OBESITY:  Recommended Dietary Goals  Jane Armstrong is currently in the action  stage of change. As such, her goal is to continue weight management plan. She has agreed to: continue current plan  Behavioral Intervention  We discussed the following Behavioral Modification Strategies today: increasing lean protein intake, decreasing simple carbohydrates , increasing vegetables, increasing lower glycemic fruits, increasing water intake, continue to practice mindfulness when eating, and planning for success.  Additional resources provided today: None  Recommended Physical Activity Goals  Jane Armstrong has been advised to work up to 150 minutes of moderate intensity aerobic activity a week and strengthening exercises 2-3 times per week for cardiovascular health, weight loss maintenance and preservation of muscle mass.   She has agreed to :  Continue current level of physical activity   Pharmacotherapy We discussed various medication options to help Jane Armstrong with her weight loss efforts and we both agreed to : increase wegovy 1 mg once a week.  ASSOCIATED CONDITIONS ADDRESSED TODAY  Class 2 severe obesity with serious comorbidity and body mass index (BMI) of 39.0 to 39.9 in adult, unspecified obesity type (HCC) -     Semaglutide-Weight Management; Inject 1 mg into the skin once a week for 28 days.  Dispense: 2 mL; Refill: 0  Essential hypertension Assessment & Plan: Blood pressure is at goal.  On hydrochlorothiazide 25 mg a day and telmisartan 40 mg daily.  Blood pressure is now low normal.   Will monitor for orthostasis while losing weight.  Continue current regimen.   Prediabetes Assessment & Plan: Most recent A1c is  Lab Results  Component Value Date  HGBA1C 5.7 (H) 12/10/2022   HGBA1C 5.8 04/18/2018   She also has elevated insulin levels. Hyperinsulinemia is associated with an increased risk of weight gain, cardiovascular disease, inflammation and Oncogenesis.  She is currently on Wegovy without any adverse effects.  We will increase medication to 1 mg once a week.   Patient also counseled on the role of carbohydrates and she will continue to work on reducing simple carbs from her diet.      PHYSICAL EXAM:  Blood pressure 102/69, pulse 80, temperature 98.6 F (37 C), height 5\' 8"  (1.727 m), weight 252 lb (114.3 kg), SpO2 99 %. Body mass index is 38.32 kg/m.  General: She is overweight, cooperative, alert, well developed, and in no acute distress. PSYCH: Has normal mood, affect and thought process.   HEENT: EOMI, sclerae are anicteric. Lungs: Normal breathing effort, no conversational dyspnea. Extremities: No edema.  Neurologic: No gross sensory or motor deficits. No tremors or fasciculations noted.    DIAGNOSTIC DATA REVIEWED:  BMET    Component Value Date/Time   NA 144 12/10/2022 1654   K 4.1 12/10/2022 1654   CL 103 12/10/2022 1654   CO2 25 12/10/2022 1654   GLUCOSE 88 12/10/2022 1654   BUN 10 12/10/2022 1654   CREATININE 0.78 12/10/2022 1654   CALCIUM 9.7 12/10/2022 1654   GFRNONAA 82 11/10/2020 1045   GFRAA 94 11/10/2020 1045   Lab Results  Component Value Date   HGBA1C 5.7 (H) 12/10/2022   HGBA1C 5.8 04/18/2018   Lab Results  Component Value Date   INSULIN 22.4 12/26/2022   INSULIN 5.6 07/12/2020   Lab Results  Component Value Date   TSH 1.110 12/26/2022   CBC    Component Value Date/Time   WBC 8.3 12/10/2022 1654   RBC 5.02 12/10/2022 1654   HGB 14.3 12/10/2022 1654   HCT 42.2 12/10/2022 1654   PLT 279 12/10/2022 1654   MCV 84 12/10/2022 1654   MCH 28.5 12/10/2022 1654   MCHC 33.9 12/10/2022 1654   RDW 12.9 12/10/2022 1654   Iron Studies No results found for: "IRON", "TIBC", "FERRITIN", "IRONPCTSAT" Lipid Panel     Component Value Date/Time   CHOL 126 12/10/2022 1654   TRIG 97 12/10/2022 1654   HDL 55 12/10/2022 1654   CHOLHDL 2.3 12/10/2022 1654   LDLCALC 53 12/10/2022 1654   Hepatic Function Panel     Component Value Date/Time   PROT 8.0 12/10/2022 1654   ALBUMIN 4.7 12/10/2022 1654   AST 20  12/10/2022 1654   ALT 21 12/10/2022 1654   ALKPHOS 84 12/10/2022 1654   BILITOT 0.4 12/10/2022 1654      Component Value Date/Time   TSH 1.110 12/26/2022 0927   Nutritional Lab Results  Component Value Date   VD25OH 44.1 12/10/2022   VD25OH 45.2 11/14/2021   VD25OH 62.0 11/10/2020     Return in about 3 weeks (around 04/16/2023) for For Weight Mangement with Dr. Rikki Spearing.Marland Kitchen She was informed of the importance of frequent follow up visits to maximize her success with intensive lifestyle modifications for her multiple health conditions.   ATTESTASTION STATEMENTS:  Reviewed by clinician on day of visit: allergies, medications, problem list, medical history, surgical history, family history, social history, and previous encounter notes.     Worthy Rancher, MD

## 2023-03-26 NOTE — Assessment & Plan Note (Signed)
Most recent A1c is  Lab Results  Component Value Date   HGBA1C 5.7 (H) 12/10/2022   HGBA1C 5.8 04/18/2018   She also has elevated insulin levels. Hyperinsulinemia is associated with an increased risk of weight gain, cardiovascular disease, inflammation and Oncogenesis.  She is currently on Wegovy without any adverse effects.  We will increase medication to 1 mg once a week.  Patient also counseled on the role of carbohydrates and she will continue to work on reducing simple carbs from her diet.

## 2023-04-21 ENCOUNTER — Other Ambulatory Visit (INDEPENDENT_AMBULATORY_CARE_PROVIDER_SITE_OTHER): Payer: Self-pay | Admitting: Internal Medicine

## 2023-04-22 ENCOUNTER — Ambulatory Visit (INDEPENDENT_AMBULATORY_CARE_PROVIDER_SITE_OTHER): Payer: BC Managed Care – PPO | Admitting: Internal Medicine

## 2023-04-22 DIAGNOSIS — L718 Other rosacea: Secondary | ICD-10-CM | POA: Diagnosis not present

## 2023-04-22 DIAGNOSIS — D485 Neoplasm of uncertain behavior of skin: Secondary | ICD-10-CM | POA: Diagnosis not present

## 2023-04-22 DIAGNOSIS — D2271 Melanocytic nevi of right lower limb, including hip: Secondary | ICD-10-CM | POA: Diagnosis not present

## 2023-04-24 ENCOUNTER — Encounter (INDEPENDENT_AMBULATORY_CARE_PROVIDER_SITE_OTHER): Payer: Self-pay | Admitting: Internal Medicine

## 2023-04-24 MED ORDER — WEGOVY 1 MG/0.5ML ~~LOC~~ SOAJ: 1.0000 mg | SUBCUTANEOUS | 0 refills | Status: DC

## 2023-05-01 ENCOUNTER — Ambulatory Visit (INDEPENDENT_AMBULATORY_CARE_PROVIDER_SITE_OTHER): Payer: BC Managed Care – PPO | Admitting: Internal Medicine

## 2023-05-01 ENCOUNTER — Encounter (INDEPENDENT_AMBULATORY_CARE_PROVIDER_SITE_OTHER): Payer: Self-pay | Admitting: Internal Medicine

## 2023-05-01 VITALS — BP 114/78 | HR 90 | Temp 98.2°F | Ht 68.0 in | Wt 241.0 lb

## 2023-05-01 DIAGNOSIS — R7303 Prediabetes: Secondary | ICD-10-CM | POA: Diagnosis not present

## 2023-05-01 DIAGNOSIS — Z6836 Body mass index (BMI) 36.0-36.9, adult: Secondary | ICD-10-CM | POA: Diagnosis not present

## 2023-05-01 DIAGNOSIS — I10 Essential (primary) hypertension: Secondary | ICD-10-CM | POA: Diagnosis not present

## 2023-05-01 MED ORDER — WEGOVY 1 MG/0.5ML ~~LOC~~ SOAJ
1.0000 mg | SUBCUTANEOUS | 0 refills | Status: DC
Start: 2023-05-01 — End: 2023-06-27

## 2023-05-01 NOTE — Assessment & Plan Note (Signed)
Her blood pressure has improved significantly.  She is currently on hydrochlorothiazide and ARB.  She denies any orthostatic dizziness.  She has lost 10% of body weight.  She will monitor for orthostasis as she continues to lose weight.  Continue current regimen

## 2023-05-01 NOTE — Assessment & Plan Note (Signed)
Jane Armstrong has lost approximately 10% of her body weight or 20 pounds.  Her BIA information shows a reduction in body fat percentage improvement in visceral fat rating with relative preservation of muscle mass.  Her loss ratio is less than 20%.  She has good adherence to reduced calorie nutrition plan and is exercising on a weekly basis.  Incretin therapy has been of tremendous benefit for patient and is not experiencing any side effects.  We will continue lowest effective dose.

## 2023-05-01 NOTE — Assessment & Plan Note (Signed)
She is currently on [facility] for pharmacoprophylaxis.  Her initial hemoglobin A1c was 5.7 and previous was 5.8.  We will be repeating hemoglobin A1c sometime in October and also fasting blood sugar and insulin levels.

## 2023-05-01 NOTE — Progress Notes (Signed)
Office: [phone]  /  Fax: [phone]  WEIGHT SUMMARY AND BIOMETRICS  Vitals Temp: 98.2 F (36.8 C) BP: 114/78 Pulse Rate: 90 SpO2: 99 %   Anthropometric Measurements Height: 5\' 8"  (1.727 m) Weight: 241 lb (109.3 kg) BMI (Calculated): 36.65 Weight at Last Visit: 252 lb Weight Lost Since Last Visit: 11 lb Weight Gained Since Last Visit: 0lb Starting Weight: 260 lb Total Weight Loss (lbs): 19 lb (8.618 kg) Peak Weight: 269 lb   Body Composition  Body Fat %: 40.7 % Fat Mass (lbs): 98.2 lbs Muscle Mass (lbs): 136 lbs Total Body Water (lbs): 84.2 lbs Visceral Fat Rating : 11    No data recorded Today's Visit #: 5  Starting Date: 12/17/22   HPI  Chief Complaint: OBESITY  Jane Armstrong is here to discuss her progress with her obesity treatment plan. She is on the the Category 3 Plan and states she is following her eating plan approximately 90 % of the time. She states she is exercising 60 minutes 2 times per week.  Interval History:  Since last office visit she has lost 11 lbs. She reports good adherence to reduced calorie nutritional plan. She has been working on not skipping meals, increasing protein intake at every meal, eating more fruits, eating more vegetables, drinking more water, avoiding and / or reducing liquid calories, and continues to exercise  Orixegenic Control: Denies problems with appetite and hunger signals.  Denies problems with satiety and satiation.  Denies problems with eating patterns and portion control.  Denies abnormal cravings. Denies feeling deprived or restricted.   Barriers identified: none.   Pharmacotherapy for weight loss: She is currently taking Wegovy with adequate clinical response  and without side effects..    ASSESSMENT AND PLAN  TREATMENT PLAN FOR OBESITY:  Recommended Dietary Goals  Jane Armstrong is currently in the action stage of change. As such, her goal is to continue weight management plan. She has agreed to:  continue current plan  Behavioral Intervention  We discussed the following Behavioral Modification Strategies today: increasing lean protein intake, decreasing simple carbohydrates , increasing vegetables, increasing lower glycemic fruits, increasing water intake, continue to practice mindfulness when eating, and planning for success.  Additional resources provided today: None  Recommended Physical Activity Goals  Jane Armstrong has been advised to work up to 150 minutes of moderate intensity aerobic activity a week and strengthening exercises 2-3 times per week for cardiovascular health, weight loss maintenance and preservation of muscle mass.   She has agreed to :  Continue current level of physical activity   Pharmacotherapy We discussed various medication options to help Jane Armstrong with her weight loss efforts and we both agreed to : continue current anti-obesity medication regimen  ASSOCIATED CONDITIONS ADDRESSED TODAY  Class 2 severe obesity with serious comorbidity and body mass index (BMI) of 39.0 to 39.9 in adult, unspecified obesity type [facility]) Assessment & Plan: Jane Armstrong has lost approximately 10% of her body weight or 20 pounds.  Her BIA information shows a reduction in body fat percentage improvement in visceral fat rating with relative preservation of muscle mass.  Her loss ratio is less than 20%.  She has good adherence to reduced calorie nutrition plan and is exercising on a weekly basis.  Incretin therapy has been of tremendous benefit for patient and is not experiencing any side effects.  We will continue lowest effective dose.  Orders: -     Wegovy; Inject 1 mg into the skin once a week.  Dispense: 2 mL; Refill:  0  Prediabetes Assessment & Plan: She is currently on [facility] for pharmacoprophylaxis.  Her initial hemoglobin A1c was 5.7 and previous was 5.8.  We will be repeating hemoglobin A1c sometime in October and also fasting blood sugar and insulin levels.  Orders: -      Wegovy; Inject 1 mg into the skin once a week.  Dispense: 2 mL; Refill: 0  Essential hypertension Assessment & Plan: Her blood pressure has improved significantly.  She is currently on hydrochlorothiazide and ARB.  She denies any orthostatic dizziness.  She has lost 10% of body weight.  She will monitor for orthostasis as she continues to lose weight.  Continue current regimen     PHYSICAL EXAM:  Blood pressure 114/78, pulse 90, temperature 98.2 F (36.8 C), height 5\' 8"  (1.727 m), weight 241 lb (109.3 kg), last menstrual period 04/03/2023, SpO2 99%. Body mass index is 36.64 kg/m.  General: She is overweight, cooperative, alert, well developed, and in no acute distress. PSYCH: Has normal mood, affect and thought process.   HEENT: EOMI, sclerae are anicteric. Lungs: Normal breathing effort, no conversational dyspnea. Extremities: No edema.  Neurologic: No gross sensory or motor deficits. No tremors or fasciculations noted.    DIAGNOSTIC DATA REVIEWED:  BMET    Component Value Date/Time   NA 144 12/10/2022 1654   K 4.1 12/10/2022 1654   CL 103 12/10/2022 1654   CO2 25 12/10/2022 1654   GLUCOSE 88 12/10/2022 1654   BUN 10 12/10/2022 1654   CREATININE 0.78 12/10/2022 1654   CALCIUM 9.7 12/10/2022 1654   GFRNONAA 82 11/10/2020 1045   GFRAA 94 11/10/2020 1045   Lab Results  Component Value Date   HGBA1C 5.7 (H) 12/10/2022   HGBA1C 5.8 04/18/2018   Lab Results  Component Value Date   INSULIN 22.4 12/26/2022   INSULIN 5.6 07/12/2020   Lab Results  Component Value Date   TSH 1.110 12/26/2022   CBC    Component Value Date/Time   WBC 8.3 12/10/2022 1654   RBC 5.02 12/10/2022 1654   HGB 14.3 12/10/2022 1654   HCT 42.2 12/10/2022 1654   PLT 279 12/10/2022 1654   MCV 84 12/10/2022 1654   MCH 28.5 12/10/2022 1654   MCHC 33.9 12/10/2022 1654   RDW 12.9 12/10/2022 1654   Iron Studies No results found for: "IRON", "TIBC", "FERRITIN", "IRONPCTSAT" Lipid Panel      Component Value Date/Time   CHOL 126 12/10/2022 1654   TRIG 97 12/10/2022 1654   HDL 55 12/10/2022 1654   CHOLHDL 2.3 12/10/2022 1654   LDLCALC 53 12/10/2022 1654   Hepatic Function Panel     Component Value Date/Time   PROT 8.0 12/10/2022 1654   ALBUMIN 4.7 12/10/2022 1654   AST 20 12/10/2022 1654   ALT 21 12/10/2022 1654   ALKPHOS 84 12/10/2022 1654   BILITOT 0.4 12/10/2022 1654      Component Value Date/Time   TSH 1.110 12/26/2022 0927   Nutritional Lab Results  Component Value Date   VD25OH 44.1 12/10/2022   VD25OH 45.2 11/14/2021   VD25OH 62.0 11/10/2020     Return in about 3 weeks (around 05/22/2023) for For Weight Mangement with Dr. Rikki Spearing.Marland Kitchen She was informed of the importance of frequent follow up visits to maximize her success with intensive lifestyle modifications for her multiple health conditions.   ATTESTASTION STATEMENTS:  Reviewed by clinician on day of visit: allergies, medications, problem list, medical history, surgical history, family history, social history, and previous  encounter notes.     Worthy Rancher, MD

## 2023-05-09 DIAGNOSIS — L988 Other specified disorders of the skin and subcutaneous tissue: Secondary | ICD-10-CM | POA: Diagnosis not present

## 2023-05-09 DIAGNOSIS — D485 Neoplasm of uncertain behavior of skin: Secondary | ICD-10-CM | POA: Diagnosis not present

## 2023-05-27 ENCOUNTER — Ambulatory Visit (INDEPENDENT_AMBULATORY_CARE_PROVIDER_SITE_OTHER): Payer: BC Managed Care – PPO | Admitting: Internal Medicine

## 2023-06-27 ENCOUNTER — Ambulatory Visit (INDEPENDENT_AMBULATORY_CARE_PROVIDER_SITE_OTHER): Payer: BC Managed Care – PPO | Admitting: Internal Medicine

## 2023-06-27 ENCOUNTER — Encounter (INDEPENDENT_AMBULATORY_CARE_PROVIDER_SITE_OTHER): Payer: Self-pay | Admitting: Internal Medicine

## 2023-06-27 VITALS — BP 122/86 | HR 93 | Temp 98.2°F | Ht 68.0 in | Wt 241.0 lb

## 2023-06-27 DIAGNOSIS — E669 Obesity, unspecified: Secondary | ICD-10-CM

## 2023-06-27 DIAGNOSIS — R7303 Prediabetes: Secondary | ICD-10-CM

## 2023-06-27 DIAGNOSIS — R638 Other symptoms and signs concerning food and fluid intake: Secondary | ICD-10-CM | POA: Diagnosis not present

## 2023-06-27 DIAGNOSIS — I10 Essential (primary) hypertension: Secondary | ICD-10-CM

## 2023-06-27 DIAGNOSIS — E66812 Obesity, class 2: Secondary | ICD-10-CM

## 2023-06-27 DIAGNOSIS — Z6836 Body mass index (BMI) 36.0-36.9, adult: Secondary | ICD-10-CM

## 2023-06-27 MED ORDER — SEMAGLUTIDE-WEIGHT MANAGEMENT 1.7 MG/0.75ML ~~LOC~~ SOAJ
1.7000 mg | SUBCUTANEOUS | 0 refills | Status: DC
Start: 2023-06-27 — End: 2023-07-25
  Filled 2023-06-28: qty 3, 28d supply, fill #0

## 2023-06-27 NOTE — Progress Notes (Signed)
Office: [phone]  /  Fax: [phone]  WEIGHT SUMMARY AND BIOMETRICS  Vitals Temp: 98.2 F (36.8 C) BP: 122/86 Pulse Rate: 93 SpO2: 97 %   Anthropometric Measurements Height: 5\' 8"  (1.727 m) Weight: 241 lb (109.3 kg) BMI (Calculated): 36.65 Weight at Last Visit: 241 lb Weight Lost Since Last Visit: 0 Weight Gained Since Last Visit: 0 Starting Weight: 260 lb Total Weight Loss (lbs): 19 lb (8.618 kg) Peak Weight: 269 lb   Body Composition  Body Fat %: 41.5 % Fat Mass (lbs): 100.2 lbs Muscle Mass (lbs): 134 lbs Total Body Water (lbs): 87.2 lbs Visceral Fat Rating : 11    No data recorded Today's Visit #: 6  Starting Date: 12/17/22   HPI  Chief Complaint: OBESITY  Jane Armstrong is here to discuss her progress with her obesity treatment plan. She is on the the Category 3 Plan and states she is following her eating plan approximately 70 % of the time. She states she is exercising 50 minutes 3 times per week.  Interval History:  Since last office visit she has maintained weight. She reports good adherence to reduced calorie nutritional plan. She has been working on reading food labels, not skipping meals, increasing protein intake at every meal, drinking more water, making healthier choices, reducing portion sizes, and incorporating more whole foods  Orexigenic Control: Reports problems with appetite and hunger signals.  Reports problems with satiety and satiation.  Denies problems with eating patterns and portion control.  Denies abnormal cravings. Denies feeling deprived or restricted.   Barriers identified: strong hunger signals and impaired satiety / inhibitory control.   Pharmacotherapy for weight loss: She is currently taking Wegovy with adequate clinical response  and without side effects..    ASSESSMENT AND PLAN  TREATMENT PLAN FOR OBESITY:  Recommended Dietary Goals  Kayde is currently in the action stage of change. As such, her goal is to  continue weight management plan. She has agreed to: continue current plan  Behavioral Intervention  We discussed the following Behavioral Modification Strategies today: continue to work on maintaining a reduced calorie state, getting the recommended amount of protein, incorporating whole foods, making healthy choices, staying well hydrated and practicing mindfulness when eating..  Additional resources provided today: None  Recommended Physical Activity Goals  Jane Armstrong has been advised to work up to 150 minutes of moderate intensity aerobic activity a week and strengthening exercises 2-3 times per week for cardiovascular health, weight loss maintenance and preservation of muscle mass.   She has agreed to :  Continue current level of physical activity   Pharmacotherapy We discussed various medication options to help Jane Armstrong with her weight loss efforts and we both agreed to : increase Wegovy to 1.7 mg once a week  ASSOCIATED CONDITIONS ADDRESSED TODAY  Essential hypertension Assessment & Plan: Her blood pressure has improved significantly.  She is currently on hydrochlorothiazide and telmisartan.  She denies any orthostatic dizziness.  Continue current regimen and medically supervised weight management plan.  Monitor for orthostasis while losing weight   Class 2 severe obesity with serious comorbidity and body mass index (BMI) of 39.0 to 39.9 in adult, unspecified obesity type [facility]) Assessment & Plan: Jane Armstrong has lost approximately 10% of her body weight or 20 pounds.  Her BIA information shows a reduction in body fat percentage improvement in visceral fat rating with relative preservation of muscle mass.  Her loss ratio is less than 20%.  She has good adherence to reduced calorie nutrition plan and  is exercising on a weekly basis.  Incretin therapy has been of tremendous benefit for patient and is not experiencing any side effects.  We will continue lowest effective  dose.   Prediabetes Assessment & Plan: Most recent hemoglobin A1c is 5.7 and improved.  She is currently on Wegovy 1 mg once a week for pharmacoprophylaxis.  She will continue medication and medically supervised weight management plan   Abnormal food appetite Assessment & Plan: Improved on incretin therapy. She had increased orexigenic signaling, impaired satiety and inhibitory control. This is secondary to an abnormal energy regulation system and pathological neurohormonal pathways characteristic of excess adiposity.  In addition to nutritional and behavioral strategies she benefits from ongoing pharmacotherapy.     Generalized obesity with a starting BMI of 41 Assessment & Plan: See obesity treatment plan  Orders: -     Semaglutide-Weight Management; Inject 1.7 mg into the skin once a week.  Dispense: 3 mL; Refill: 0  BMI 36.0-36.9,adult -     Semaglutide-Weight Management; Inject 1.7 mg into the skin once a week.  Dispense: 3 mL; Refill: 0    PHYSICAL EXAM:  Blood pressure 122/86, pulse 93, temperature 98.2 F (36.8 C), height 5\' 8"  (1.727 m), weight 241 lb (109.3 kg), last menstrual period 02/16/2023, SpO2 97%. Body mass index is 36.64 kg/m.  General: She is overweight, cooperative, alert, well developed, and in no acute distress. PSYCH: Has normal mood, affect and thought process.   HEENT: EOMI, sclerae are anicteric. Lungs: Normal breathing effort, no conversational dyspnea. Extremities: No edema.  Neurologic: No gross sensory or motor deficits. No tremors or fasciculations noted.    DIAGNOSTIC DATA REVIEWED:  BMET    Component Value Date/Time   NA 144 12/10/2022 1654   K 4.1 12/10/2022 1654   CL 103 12/10/2022 1654   CO2 25 12/10/2022 1654   GLUCOSE 88 12/10/2022 1654   BUN 10 12/10/2022 1654   CREATININE 0.78 12/10/2022 1654   CALCIUM 9.7 12/10/2022 1654   GFRNONAA 82 11/10/2020 1045   GFRAA 94 11/10/2020 1045   Lab Results  Component Value Date    HGBA1C 5.7 (H) 12/10/2022   HGBA1C 5.8 04/18/2018   Lab Results  Component Value Date   INSULIN 22.4 12/26/2022   INSULIN 5.6 07/12/2020   Lab Results  Component Value Date   TSH 1.110 12/26/2022   CBC    Component Value Date/Time   WBC 8.3 12/10/2022 1654   RBC 5.02 12/10/2022 1654   HGB 14.3 12/10/2022 1654   HCT 42.2 12/10/2022 1654   PLT 279 12/10/2022 1654   MCV 84 12/10/2022 1654   MCH 28.5 12/10/2022 1654   MCHC 33.9 12/10/2022 1654   RDW 12.9 12/10/2022 1654   Iron Studies No results found for: "IRON", "TIBC", "FERRITIN", "IRONPCTSAT" Lipid Panel     Component Value Date/Time   CHOL 126 12/10/2022 1654   TRIG 97 12/10/2022 1654   HDL 55 12/10/2022 1654   CHOLHDL 2.3 12/10/2022 1654   LDLCALC 53 12/10/2022 1654   Hepatic Function Panel     Component Value Date/Time   PROT 8.0 12/10/2022 1654   ALBUMIN 4.7 12/10/2022 1654   AST 20 12/10/2022 1654   ALT 21 12/10/2022 1654   ALKPHOS 84 12/10/2022 1654   BILITOT 0.4 12/10/2022 1654      Component Value Date/Time   TSH 1.110 12/26/2022 0927   Nutritional Lab Results  Component Value Date   VD25OH 44.1 12/10/2022   VD25OH 45.2 11/14/2021  VD25OH 62.0 11/10/2020     Return in about 3 weeks (around 07/18/2023) for For Weight Mangement with Dr. Rikki Spearing.Marland Kitchen She was informed of the importance of frequent follow up visits to maximize her success with intensive lifestyle modifications for her multiple health conditions.   ATTESTASTION STATEMENTS:  Reviewed by clinician on day of visit: allergies, medications, problem list, medical history, surgical history, family history, social history, and previous encounter notes.     Worthy Rancher, MD

## 2023-06-28 ENCOUNTER — Other Ambulatory Visit: Payer: Self-pay

## 2023-06-28 ENCOUNTER — Other Ambulatory Visit (HOSPITAL_COMMUNITY): Payer: Self-pay

## 2023-06-28 DIAGNOSIS — E669 Obesity, unspecified: Secondary | ICD-10-CM | POA: Insufficient documentation

## 2023-06-28 DIAGNOSIS — R638 Other symptoms and signs concerning food and fluid intake: Secondary | ICD-10-CM | POA: Insufficient documentation

## 2023-06-28 NOTE — Assessment & Plan Note (Signed)
Improved on incretin therapy.  She had increased orexigenic signaling, impaired satiety and inhibitory control. This is secondary to an abnormal energy regulation system and pathological neurohormonal pathways characteristic of excess adiposity.  In addition to nutritional and behavioral strategies she benefits from ongoing pharmacotherapy.

## 2023-06-28 NOTE — Assessment & Plan Note (Signed)
Her blood pressure has improved significantly.  She is currently on hydrochlorothiazide and telmisartan.  She denies any orthostatic dizziness.  Continue current regimen and medically supervised weight management plan.  Monitor for orthostasis while losing weight

## 2023-06-28 NOTE — Assessment & Plan Note (Signed)
Jane Armstrong has lost approximately 10% of her body weight or 20 pounds.  Her BIA information shows a reduction in body fat percentage improvement in visceral fat rating with relative preservation of muscle mass.  Her loss ratio is less than 20%.  She has good adherence to reduced calorie nutrition plan and is exercising on a weekly basis.  Incretin therapy has been of tremendous benefit for patient and is not experiencing any side effects.  We will continue lowest effective dose.

## 2023-06-28 NOTE — Assessment & Plan Note (Signed)
Most recent hemoglobin A1c is 5.7 and improved.  She is currently on Wegovy 1 mg once a week for pharmacoprophylaxis.  She will continue medication and medically supervised weight management plan

## 2023-06-28 NOTE — Assessment & Plan Note (Signed)
 See obesity treatment plan

## 2023-07-03 ENCOUNTER — Telehealth (INDEPENDENT_AMBULATORY_CARE_PROVIDER_SITE_OTHER): Payer: Self-pay

## 2023-07-03 NOTE — Telephone Encounter (Signed)
Call to [facility] prior auth department.  Spoke to Alpha.  [id number].  Awaiting determination. Faxed last two office visit notes to [phone].

## 2023-07-04 NOTE — Telephone Encounter (Addendum)
Message from plan: Request Reference Number: [id number]. WEGOVY INJ 1.7MG  is approved through 01/02/2024. Your patient may now fill this prescription and it will be covered.. Authorization Expiration Date: January 02, 2024.  Patient has been notified via my chart.

## 2023-07-07 ENCOUNTER — Encounter: Payer: Self-pay | Admitting: Nurse Practitioner

## 2023-07-08 ENCOUNTER — Other Ambulatory Visit: Payer: Self-pay

## 2023-07-08 MED ORDER — CETIRIZINE HCL 10 MG PO TABS: 10.0000 mg | ORAL_TABLET | Freq: Every day | ORAL | 1 refills | Status: DC

## 2023-07-18 ENCOUNTER — Ambulatory Visit (INDEPENDENT_AMBULATORY_CARE_PROVIDER_SITE_OTHER): Payer: BC Managed Care – PPO | Admitting: Internal Medicine

## 2023-07-22 ENCOUNTER — Ambulatory Visit: Payer: BC Managed Care – PPO | Admitting: Nurse Practitioner

## 2023-07-25 ENCOUNTER — Ambulatory Visit (INDEPENDENT_AMBULATORY_CARE_PROVIDER_SITE_OTHER): Payer: BC Managed Care – PPO | Admitting: Family Medicine

## 2023-07-25 ENCOUNTER — Encounter (INDEPENDENT_AMBULATORY_CARE_PROVIDER_SITE_OTHER): Payer: Self-pay | Admitting: Family Medicine

## 2023-07-25 ENCOUNTER — Other Ambulatory Visit (HOSPITAL_COMMUNITY): Payer: Self-pay

## 2023-07-25 VITALS — BP 99/67 | HR 80 | Temp 98.1°F | Ht 68.0 in | Wt 239.0 lb

## 2023-07-25 DIAGNOSIS — E669 Obesity, unspecified: Secondary | ICD-10-CM | POA: Diagnosis not present

## 2023-07-25 DIAGNOSIS — R7303 Prediabetes: Secondary | ICD-10-CM | POA: Diagnosis not present

## 2023-07-25 DIAGNOSIS — E559 Vitamin D deficiency, unspecified: Secondary | ICD-10-CM

## 2023-07-25 DIAGNOSIS — Z6836 Body mass index (BMI) 36.0-36.9, adult: Secondary | ICD-10-CM | POA: Diagnosis not present

## 2023-07-25 MED ORDER — WEGOVY 2.4 MG/0.75ML ~~LOC~~ SOAJ
2.4000 mg | SUBCUTANEOUS | 0 refills | Status: DC
  Filled 2023-07-25: qty 3, 28d supply, fill #0

## 2023-07-25 NOTE — Progress Notes (Signed)
Carlye Grippe, D.O.  ABFM, ABOM Specializing in Clinical Bariatric Medicine  Office located at: [address] [place], [name]  [address]   Assessment and Plan:   FOR THE DISEASE OF OBESITY:  Since last office visit on 06/27/23 patient's  Muscle mass has increased by 0.6 lb. Fat mass has decreased by 2.6 lb. Total body water has decreased by 1 lb.  Counseling done on how various foods will affect these numbers and how to maximize success  Total lbs lost to date: 21 lbs  Total weight loss percentage to date: 8.08%    Recommended Dietary Goals Chiquetta is currently in the action stage of change. As such, her goal is to continue weight management plan.  She has agreed to: continue current plan   Behavioral Intervention We discussed the following Behavioral Modification Strategies today: increasing low-net carb foods, increasing fiber rich foods, moving proteins to earlier in the day, celebration eating strategies, increasing water intake.  Additional resources provided today: None  Evidence-based interventions for health behavior change were utilized today including the discussion of self monitoring techniques, problem-solving barriers and SMART goal setting techniques.   Regarding patient's less desirable eating habits and patterns, we employed the technique of small changes.   Pt will specifically work on: increasing lean protein intake.   Recommended Physical Activity Goals Rashika has been advised to work up to 150 minutes of moderate intensity aerobic activity a week and strengthening exercises 2-3 times per week for cardiovascular health, weight loss maintenance and preservation of muscle mass.   She has agreed to :  Continue current level of physical activity    Pharmacotherapy We discussed various medication options to help Doneshia with her weight loss efforts and we both agreed to : increase Wegovy to 2.4 mg once wkly   FOR ASSOCIATED CONDITIONS ADDRESSED  TODAY:  BMI 36.0-36.9,adult Generalized obesity with a starting BMI of 41 Assessment & Plan: Pt endorses taking Wegovy 1.7 mg once wkly without any  GI upset. Pt notices having some increased snacking tendencies/cravings especially after dinner; she admits to not eating all the foods on her meal plan.   Reminded patient that having adequate amounts of protein with each meal is important for controlling hunger and cravings and improving metabolism. Pt agrees to increase protein intake and work on eating all the foods on her meal plan. In addition , we will also increase Wegovy to 2.4 mg once a week to further aid with weight loss efforts.   Orders: -  Wegovy; Inject 2.4 mg into the skin once a week.  Dispense: 3 mL; Refill: 0   Prediabetes Assessment & Plan: Most recent Hemoglobin A1c of 5.7 on 12/10/22. Pt endorses having increased cravings (see obesity note).   Lota will continue to work on weight loss, exercise, via their meal plan we devised to help decrease the risk of progressing to diabetes. Will increase Wegovy today. Will recheck labs today.   Orders: -   Wegovy; Inject 2.4 mg into the skin once a week.  Dispense: 3 mL; Refill: 0 -   Hemoglobin A1c   Vitamin D deficiency Assessment & Plan: Last vitamin D of 44.1 on 12/10/22. Pt endorses consistently taking ERGO 50,000 units once a week.   Continue with weight loss efforts and high-dose rx vitamin D. Will recheck labs today.   Orders: -   VITAMIN D 25 Hydroxy (Vit-D Deficiency, Fractures)   FOLLOW UP: Return 08/19/23. She was informed of the importance of frequent  follow up visits to maximize her success with intensive lifestyle modifications for her multiple health conditions.  Jessilyn Bennett-Thorpe is aware that we will review all of her lab results at our next visit.  She is aware that if anything is critical/ life threatening with the results, we will be contacting her via MyChart prior to the office visit to discuss  management.   Subjective:   Chief complaint: Obesity Shatera is here to discuss her progress with her obesity treatment plan. She is on the Category 3 Plan and states she is following her eating plan approximately 90% of the time. She states she is doing weights/cardio 60 minutes 3 days per week.  Interval History:  Ardys Hataway is here for a follow up office visit. This is my first encounter with Lenor; she typically sees my colleague Dr.Maldonado. Armanie is down 2 lbs. Pt notices having some increased snacking tendencies/cravings especially after dinner;  she admits to not eating all the foods on her meal plan.   Barriers identified: none  Pharmacotherapy for weight loss: She is currently taking  Wegovy 1.7 mg once a week   without any adverse side effects.   Review of Systems:  Pertinent positives were addressed with patient today.  Reviewed by clinician on day of visit: allergies, medications, problem list, medical history, surgical history, family history, social history, and previous encounter notes.  Weight Summary and Biometrics   Weight Lost Since Last Visit: 2lb  Weight Gained Since Last Visit: 0lb    Vitals Temp: 98.1 F (36.7 C) BP: 99/67 Pulse Rate: 80 SpO2: 97 %   Anthropometric Measurements Height: 5\' 8"  (1.727 m) Weight: 239 lb (108.4 kg) BMI (Calculated): 36.35 Weight at Last Visit: 241lb Weight Lost Since Last Visit: 2lb Weight Gained Since Last Visit: 0lb Starting Weight: 260 lb Total Weight Loss (lbs): 21 lb (9.526 kg) Peak Weight: 269 lb   Body Composition  Body Fat %: 40.8 % Fat Mass (lbs): 97.6 lbs Muscle Mass (lbs): 134.6 lbs Total Body Water (lbs): 86.2 lbs Visceral Fat Rating : 11   Other Clinical Data Fasting: no Labs: no Today's Visit #: 7 Starting Date: 12/17/22   Objective:   PHYSICAL EXAM: Blood pressure 99/67, pulse 80, temperature 98.1 F (36.7 C), height 5\' 8"  (1.727 m), weight 239 lb (108.4 kg), last  menstrual period 02/16/2023, SpO2 97%. Body mass index is 36.34 kg/m.  General: she is overweight, cooperative and in no acute distress. PSYCH: Has normal mood, affect and thought process.   HEENT: EOMI, sclerae are anicteric. Lungs: Normal breathing effort, no conversational dyspnea. Extremities: Moves * 4 Neurologic: A and O * 3, good insight  DIAGNOSTIC DATA REVIEWED: BMET    Component Value Date/Time   NA 144 12/10/2022 1654   K 4.1 12/10/2022 1654   CL 103 12/10/2022 1654   CO2 25 12/10/2022 1654   GLUCOSE 88 12/10/2022 1654   BUN 10 12/10/2022 1654   CREATININE 0.78 12/10/2022 1654   CALCIUM 9.7 12/10/2022 1654   GFRNONAA 82 11/10/2020 1045   GFRAA 94 11/10/2020 1045   Lab Results  Component Value Date   HGBA1C 5.7 (H) 12/10/2022   HGBA1C 5.8 04/18/2018   Lab Results  Component Value Date   INSULIN 22.4 12/26/2022   INSULIN 5.6 07/12/2020   Lab Results  Component Value Date   TSH 1.110 12/26/2022   CBC    Component Value Date/Time   WBC 8.3 12/10/2022 1654   RBC 5.02 12/10/2022 1654   HGB  14.3 12/10/2022 1654   HCT 42.2 12/10/2022 1654   PLT 279 12/10/2022 1654   MCV 84 12/10/2022 1654   MCH 28.5 12/10/2022 1654   MCHC 33.9 12/10/2022 1654   RDW 12.9 12/10/2022 1654   Iron Studies No results found for: "IRON", "TIBC", "FERRITIN", "IRONPCTSAT" Lipid Panel     Component Value Date/Time   CHOL 126 12/10/2022 1654   TRIG 97 12/10/2022 1654   HDL 55 12/10/2022 1654   CHOLHDL 2.3 12/10/2022 1654   LDLCALC 53 12/10/2022 1654   Hepatic Function Panel     Component Value Date/Time   PROT 8.0 12/10/2022 1654   ALBUMIN 4.7 12/10/2022 1654   AST 20 12/10/2022 1654   ALT 21 12/10/2022 1654   ALKPHOS 84 12/10/2022 1654   BILITOT 0.4 12/10/2022 1654      Component Value Date/Time   TSH 1.110 12/26/2022 0927   Nutritional Lab Results  Component Value Date   VD25OH 44.1 12/10/2022   VD25OH 45.2 11/14/2021   VD25OH 62.0 11/10/2020     Attestations:   I, Special Puri, acting as a Stage manager for [employer], DO., have compiled all relevant documentation for today's office visit on behalf of Thomasene Lot, DO, while in the presence of [employer], DO.  I have reviewed the above documentation for accuracy and completeness, and I agree with the above. Carlye Grippe, D.O.  The 21st Century Cures Act was signed into law in 2016 which includes the topic of electronic health records.  This provides immediate access to information in MyChart.  This includes consultation notes, operative notes, office notes, lab results and pathology reports.  If you have any questions about what you read please let us know at your next visit so we can discuss your concerns and take corrective action if need be.  We are right here with you.

## 2023-07-26 ENCOUNTER — Other Ambulatory Visit (HOSPITAL_COMMUNITY): Payer: Self-pay

## 2023-07-26 LAB — VITAMIN D 25 HYDROXY (VIT D DEFICIENCY, FRACTURES): Vit D, 25-Hydroxy: 45.4 ng/mL (ref 30.0–100.0)

## 2023-07-26 LAB — HEMOGLOBIN A1C
Est. average glucose Bld gHb Est-mCnc: 114 mg/dL
Hgb A1c MFr Bld: 5.6 % (ref 4.8–5.6)

## 2023-08-05 ENCOUNTER — Ambulatory Visit: Payer: BC Managed Care – PPO | Admitting: Nurse Practitioner

## 2023-08-19 ENCOUNTER — Ambulatory Visit (INDEPENDENT_AMBULATORY_CARE_PROVIDER_SITE_OTHER): Payer: BC Managed Care – PPO | Admitting: Internal Medicine

## 2023-08-19 ENCOUNTER — Other Ambulatory Visit (HOSPITAL_COMMUNITY): Payer: Self-pay

## 2023-08-19 ENCOUNTER — Ambulatory Visit (INDEPENDENT_AMBULATORY_CARE_PROVIDER_SITE_OTHER): Payer: BC Managed Care – PPO | Admitting: Family Medicine

## 2023-08-19 ENCOUNTER — Encounter (INDEPENDENT_AMBULATORY_CARE_PROVIDER_SITE_OTHER): Payer: Self-pay | Admitting: Internal Medicine

## 2023-08-19 VITALS — BP 113/78 | HR 78 | Temp 98.1°F | Ht 68.0 in | Wt 236.0 lb

## 2023-08-19 DIAGNOSIS — Z6836 Body mass index (BMI) 36.0-36.9, adult: Secondary | ICD-10-CM

## 2023-08-19 DIAGNOSIS — E669 Obesity, unspecified: Secondary | ICD-10-CM

## 2023-08-19 DIAGNOSIS — I10 Essential (primary) hypertension: Secondary | ICD-10-CM | POA: Diagnosis not present

## 2023-08-19 DIAGNOSIS — R7303 Prediabetes: Secondary | ICD-10-CM | POA: Diagnosis not present

## 2023-08-19 DIAGNOSIS — E559 Vitamin D deficiency, unspecified: Secondary | ICD-10-CM

## 2023-08-19 MED ORDER — WEGOVY 2.4 MG/0.75ML ~~LOC~~ SOAJ
2.4000 mg | SUBCUTANEOUS | 0 refills | Status: DC
  Filled 2023-08-19: qty 3, 28d supply, fill #0

## 2023-08-19 NOTE — Assessment & Plan Note (Signed)
Her vitamin D levels are replenished.  She has been receiving high-dose vitamin D supplement from primary care team she could potentially switch to vitamin D3 2000 international units daily for maintenance.

## 2023-08-19 NOTE — Assessment & Plan Note (Signed)
Patient has lost 24 pounds on medically supervised weight management plan.  She is currently on Wegovy 2.4 mg a week without any adverse effects.  BIA shows a reduction in body fat.  She is also had some muscle loss but less than 20% of weight loss which is susceptible.  She will continue with current levels of physical activity, avoid skipping meals and increasing protein intake to 30 to 40 g per meal.  She will continue GLP-1 therapy

## 2023-08-19 NOTE — Assessment & Plan Note (Signed)
Her blood pressure has improved significantly.  She is currently on hydrochlorothiazide and telmisartan.  She denies any orthostatic dizziness.  Continue current regimen and medically supervised weight management plan.  Monitor for orthostasis while losing weight

## 2023-08-19 NOTE — Progress Notes (Signed)
Office: [phone]  /  Fax: [phone]  Weight Summary And Biometrics  Vitals Temp: 98.1 F (36.7 C) BP: 113/78 Pulse Rate: 78 SpO2: 98 %   Anthropometric Measurements Height: 5\' 8"  (1.727 m) Weight: 236 lb (107 kg) BMI (Calculated): 35.89 Weight at Last Visit: 239 lb Weight Lost Since Last Visit: 3 lb Weight Gained Since Last Visit: 0 lb Starting Weight: 260 lb Total Weight Loss (lbs): 24 lb (10.9 kg) Peak Weight: 269 lb   Body Composition  Body Fat %: 40.7 % Fat Mass (lbs): 96.2 lbs Muscle Mass (lbs): 133 lbs Total Body Water (lbs): 86.6 lbs Visceral Fat Rating : 11    No data recorded Today's Visit #: 8  Starting Date: 12/17/22   Subjective   Chief Complaint: Obesity  Jane Armstrong is here to discuss her progress with her obesity treatment plan. She is on the the Category 3 Plan and states she is following her eating plan approximately 70 % of the time. She states she is exercising 90 minutes 3 times per week.  Interval History:  Discussed the use of AI scribe software for clinical note transcription with the patient, who gave verbal consent to proceed.  History of Present Illness   The patient, a 52 year old individual with a history of obesity, presents for a follow-up visit. She reports a successful weight loss of three pounds over the Thanksgiving holiday, despite the typical challenges associated with holiday eating. The patient attributes her weight loss success to a combination of a conscious nutrition plan and regular physical activity. She has been engaging in 90-minute workouts three times a week, totaling 270 minutes of exercise per week. The patient has her own workout equipment at home and receives coaching from a coworker who is a Chief Executive Officer.  The patient reports feeling better after workouts and even feels a sense of missing out on rest days. She has noticed changes in her clothing size, with her jeans feeling looser. She has been adhering to  a regimen of three meals a day, although she tries to keep her afternoon and evening meals light due to occasional bouts of nausea. She has noticed that eating too late in the evening disrupts her sleep, so she tries to stay up long enough to digest her food if she eats later in the day.  The patient is currently on Wegovy for weight loss and has been approved for this medication through 2025. She has noticed a decrease in her A1C levels to 5.6 and a decrease in her body fat percentage from 44% to 40%. The patient acknowledges the importance of maintaining her protein intake and hydration levels, and she is aware of the potential for hunger signals to return once she stops taking the medication. She expresses a desire to try managing her weight without the medication once her current approval expires.  The patient is also on a high dose of vitamin D (50,000 units once a week) but has run out of this medication. She is considering switching to an over-the-counter dose of 2,000 units daily. The patient is scheduled to see another doctor next week for further discussion on this matter.    Orexigenic Control:  Denies problems with appetite and hunger signals.  Denies problems with satiety and satiation.  Denies problems with eating patterns and portion control.  Denies abnormal cravings. Denies feeling deprived or restricted.   Barriers identified: none.   Pharmacotherapy for weight loss: She is currently taking Wegovy with adequate clinical response  and  without side effects..   Assessment and Plan   Treatment Plan For Obesity:  Recommended Dietary Goals  Jane Armstrong is currently in the action stage of change. As such, her goal is to continue weight management plan. She has agreed to: continue current plan  Behavioral Intervention  We discussed the following Behavioral Modification Strategies today: continue to work on maintaining a reduced calorie state, getting the recommended amount of  protein, incorporating whole foods, making healthy choices, staying well hydrated and practicing mindfulness when eating..  Additional resources provided today: Handout on traveling and holiday eating strategies  Recommended Physical Activity Goals  Jane Armstrong has been advised to work up to 150 minutes of moderate intensity aerobic activity a week and strengthening exercises 2-3 times per week for cardiovascular health, weight loss maintenance and preservation of muscle mass.   She has agreed to :  Continue current level of physical activity   Pharmacotherapy  We discussed various medication options to help Jane Armstrong with her weight loss efforts and we both agreed to : continue current anti-obesity medication regimen  Associated Conditions Addressed Today  Essential hypertension Assessment & Plan: Her blood pressure has improved significantly.  She is currently on hydrochlorothiazide and telmisartan.  She denies any orthostatic dizziness.  Continue current regimen and medically supervised weight management plan.  Monitor for orthostasis while losing weight   Prediabetes Assessment & Plan: Most recent hemoglobin A1c is 5.7 and improved.  She is currently on Wegovy 2.4 mg once a week for pharmacoprophylaxis.  She will continue medication and medically supervised weight management plan  Orders: -     [id number]; Inject 2.4 mg into the skin once a week.  Dispense: 3 mL; Refill: 0  BMI 36.0-36.9,adult- current 36.35 -     LOVFIE; Inject 2.4 mg into the skin once a week.  Dispense: 3 mL; Refill: 0  Generalized obesity with a starting BMI of 41 Assessment & Plan: Patient has lost 24 pounds on medically supervised weight management plan.  She is currently on Wegovy 2.4 mg a week without any adverse effects.  BIA shows a reduction in body fat.  She is also had some muscle loss but less than 20% of weight loss which is susceptible.  She will continue with current levels of physical activity, avoid  skipping meals and increasing protein intake to 30 to 40 g per meal.  She will continue GLP-1 therapy         Orders: -     PPIRJJ; Inject 2.4 mg into the skin once a week.  Dispense: 3 mL; Refill: 0  Vitamin D deficiency Assessment & Plan: Her vitamin D levels are replenished.  She has been receiving high-dose vitamin D supplement from primary care team she could potentially switch to vitamin D3 2000 international units daily for maintenance.      Objective   Physical Exam:  Blood pressure 113/78, pulse 78, temperature 98.1 F (36.7 C), height 5\' 8"  (1.727 m), weight 236 lb (107 kg), SpO2 98%. Body mass index is 35.88 kg/m.  General: She is overweight, cooperative, alert, well developed, and in no acute distress. PSYCH: Has normal mood, affect and thought process.   HEENT: EOMI, sclerae are anicteric. Lungs: Normal breathing effort, no conversational dyspnea. Extremities: No edema.  Neurologic: No gross sensory or motor deficits. No tremors or fasciculations noted.    Diagnostic Data Reviewed:  BMET    Component Value Date/Time   NA 144 12/10/2022 1654   K 4.1 12/10/2022 1654   CL  103 12/10/2022 1654   CO2 25 12/10/2022 1654   GLUCOSE 88 12/10/2022 1654   BUN 10 12/10/2022 1654   CREATININE 0.78 12/10/2022 1654   CALCIUM 9.7 12/10/2022 1654   GFRNONAA 82 11/10/2020 1045   GFRAA 94 11/10/2020 1045   Lab Results  Component Value Date   HGBA1C 5.6 07/25/2023   HGBA1C 5.8 04/18/2018   Lab Results  Component Value Date   INSULIN 22.4 12/26/2022   INSULIN 5.6 07/12/2020   Lab Results  Component Value Date   TSH 1.110 12/26/2022   CBC    Component Value Date/Time   WBC 8.3 12/10/2022 1654   RBC 5.02 12/10/2022 1654   HGB 14.3 12/10/2022 1654   HCT 42.2 12/10/2022 1654   PLT 279 12/10/2022 1654   MCV 84 12/10/2022 1654   MCH 28.5 12/10/2022 1654   MCHC 33.9 12/10/2022 1654   RDW 12.9 12/10/2022 1654   Iron Studies No results found for: "IRON",  "TIBC", "FERRITIN", "IRONPCTSAT" Lipid Panel     Component Value Date/Time   CHOL 126 12/10/2022 1654   TRIG 97 12/10/2022 1654   HDL 55 12/10/2022 1654   CHOLHDL 2.3 12/10/2022 1654   LDLCALC 53 12/10/2022 1654   Hepatic Function Panel     Component Value Date/Time   PROT 8.0 12/10/2022 1654   ALBUMIN 4.7 12/10/2022 1654   AST 20 12/10/2022 1654   ALT 21 12/10/2022 1654   ALKPHOS 84 12/10/2022 1654   BILITOT 0.4 12/10/2022 1654      Component Value Date/Time   TSH 1.110 12/26/2022 0927   Nutritional Lab Results  Component Value Date   VD25OH 45.4 07/25/2023   VD25OH 44.1 12/10/2022   VD25OH 45.2 11/14/2021    Follow-Up   Return in about 4 weeks (around 09/16/2023) for For Weight Mangement with Dr. Rikki Spearing.Marland Kitchen She was informed of the importance of frequent follow up visits to maximize her success with intensive lifestyle modifications for her multiple health conditions.  Attestation Statement   Reviewed by clinician on day of visit: allergies, medications, problem list, medical history, surgical history, family history, social history, and previous encounter notes.     Worthy Rancher, MD

## 2023-08-19 NOTE — Assessment & Plan Note (Signed)
Most recent hemoglobin A1c is 5.7 and improved.  She is currently on Wegovy 2.4 mg once a week for pharmacoprophylaxis.  She will continue medication and medically supervised weight management plan

## 2023-08-20 ENCOUNTER — Other Ambulatory Visit (HOSPITAL_COMMUNITY): Payer: Self-pay

## 2023-08-27 ENCOUNTER — Ambulatory Visit: Payer: BC Managed Care – PPO | Admitting: Nurse Practitioner

## 2023-08-27 ENCOUNTER — Encounter: Payer: Self-pay | Admitting: Nurse Practitioner

## 2023-09-24 ENCOUNTER — Other Ambulatory Visit (HOSPITAL_COMMUNITY): Payer: Self-pay

## 2023-09-24 ENCOUNTER — Ambulatory Visit (INDEPENDENT_AMBULATORY_CARE_PROVIDER_SITE_OTHER): Payer: BC Managed Care – PPO | Admitting: Internal Medicine

## 2023-09-24 ENCOUNTER — Encounter (INDEPENDENT_AMBULATORY_CARE_PROVIDER_SITE_OTHER): Payer: Self-pay | Admitting: Internal Medicine

## 2023-09-24 VITALS — BP 130/74 | HR 65 | Temp 97.7°F | Ht 68.0 in | Wt 232.0 lb

## 2023-09-24 DIAGNOSIS — I10 Essential (primary) hypertension: Secondary | ICD-10-CM

## 2023-09-24 DIAGNOSIS — E66812 Obesity, class 2: Secondary | ICD-10-CM

## 2023-09-24 DIAGNOSIS — R638 Other symptoms and signs concerning food and fluid intake: Secondary | ICD-10-CM

## 2023-09-24 DIAGNOSIS — R7303 Prediabetes: Secondary | ICD-10-CM | POA: Diagnosis not present

## 2023-09-24 DIAGNOSIS — Z6835 Body mass index (BMI) 35.0-35.9, adult: Secondary | ICD-10-CM

## 2023-09-24 MED ORDER — WEGOVY 2.4 MG/0.75ML ~~LOC~~ SOAJ
2.4000 mg | SUBCUTANEOUS | 1 refills | Status: DC
  Filled 2023-09-24: qty 3, 28d supply, fill #0

## 2023-09-24 NOTE — Assessment & Plan Note (Signed)
 Her blood pressure is well-controlled.  She is currently on hydrochlorothiazide and telmisartan.  She denies any orthostatic dizziness.  Continue current regimen and medically supervised weight management plan.  Monitor for orthostasis while losing weight

## 2023-09-24 NOTE — Assessment & Plan Note (Signed)
 She has lost 32 pounds since April on medically supervised weight management program.  Please see obesity treatment plan

## 2023-09-24 NOTE — Assessment & Plan Note (Signed)
 Most recent hemoglobin A1c is 5.6 and improved.  She is currently on Wegovy 2.4 mg once a week for pharmacoprophylaxis.  She will continue medication and medically supervised weight management plan

## 2023-09-24 NOTE — Progress Notes (Signed)
 Office: [phone]  /  Fax: [phone]  Weight Summary And Biometrics  Vitals Temp: 97.7 F (36.5 C) BP: 130/74 Pulse Rate: 65 SpO2: 100 %   Anthropometric Measurements Height: 5' 8 (1.727 m) Weight: 232 lb (105.2 kg) BMI (Calculated): 35.28 Weight at Last Visit: 236 lb Weight Lost Since Last Visit: 4 lb Weight Gained Since Last Visit: 0 lb Starting Weight: 260 lb Total Weight Loss (lbs): 28 lb (12.7 kg) Peak Weight: 269 lb   Body Composition  Body Fat %: 41.1 % Fat Mass (lbs): 96.2 lbs Muscle Mass (lbs): 129.4 lbs Total Body Water (lbs): 85 lbs Visceral Fat Rating : 11    No data recorded Today's Visit #: 9  Starting Date: 12/17/22   Subjective   Chief Complaint: Obesity  Jane Armstrong is here to discuss her progress with her obesity treatment plan. She is on the the Category 3 Plan and states she is following her eating plan approximately 80 % of the time. She states she is exercising 90 minutes 3 times per week.  Interval History:   Discussed the use of AI scribe software for clinical note transcription with the patient, who gave verbal consent to proceed.  History of Present Illness   The patient, who is currently on a medical weight management program, presents today for a follow-up visit. She reports a weight loss of four pounds over the holiday period, which she attributes to cautious eating habits. She is currently consuming three meals a day and has increased her water intake. She denies consuming more carbohydrates during the holiday period, as she had her meals at her godmother's house and did not have extra food at home.  A typical day of eating for the patient includes a homemade english muffin with an egg and turkey sausage for breakfast, and a lunch of chicken and rice and vegetables, portioned to not exceed four ounces. She reports trying to eat more vegetables and just enough carbohydrates. Dinner usually consists of protein and vegetables. She  also consumes fruits throughout the week.  The patient is maintaining an active lifestyle, working out for ninety minutes three times a week. She reports getting about seven hours of restful sleep per night. She denies any issues with constipation.  Despite her weight loss, the patient has experienced a slight decrease in muscle mass. She is considering increasing her protein intake and caloric intake on workout days to address this issue. Her goal weight is yet to be determined, but she is aiming for a slow and steady weight loss of four to six pounds per month. She has not experienced any weight regain since starting the weight management program.     Orexigenic Control:  Denies problems with appetite and hunger signals.  Denies problems with satiety and satiation.  Denies problems with eating patterns and portion control.  Denies abnormal cravings. Denies feeling deprived or restricted.   Barriers identified: none.   Pharmacotherapy for weight loss: She is currently taking Wegovy  with adequate clinical response  and without side effects..   Assessment and Plan   Treatment Plan For Obesity:  Recommended Dietary Goals  Jane Armstrong is currently in the action stage of change. As such, her goal is to continue weight management plan. She has agreed to: continue current plan  Behavioral Intervention  We discussed the following Behavioral Modification Strategies today: continue to work on maintaining a reduced calorie state, getting the recommended amount of protein, incorporating whole foods, making healthy choices, staying well hydrated and practicing  mindfulness when eating..  Additional resources provided today: None  Recommended Physical Activity Goals  Jane Armstrong has been advised to work up to 150 minutes of moderate intensity aerobic activity a week and strengthening exercises 2-3 times per week for cardiovascular health, weight loss maintenance and preservation of muscle mass.   She  has agreed to :  Think about enjoyable ways to increase daily physical activity and overcoming barriers to exercise and Increase physical activity in their day and reduce sedentary time (increase NEAT).  Pharmacotherapy  We discussed various medication options to help Jane Armstrong with her weight loss efforts and we both agreed to : continue current anti-obesity medication regimen  Associated Conditions Addressed and Impacted by Obesity Treatment  Abnormal food appetite Assessment & Plan: Improved on incretin therapy. She had increased orexigenic signaling, impaired satiety and inhibitory control. This is secondary to an abnormal energy regulation system and pathological neurohormonal pathways characteristic of excess adiposity.  In addition to nutritional and behavioral strategies she benefits from ongoing pharmacotherapy Wegovy .    Orders: -     Wegovy ; Inject 2.4 mg into the skin once a week.  Dispense: 3 mL; Refill: 1  Prediabetes Assessment & Plan: Most recent hemoglobin A1c is 5.6 and improved.  She is currently on Wegovy  2.4 mg once a week for pharmacoprophylaxis.  She will continue medication and medically supervised weight management plan  Orders: -     Wegovy ; Inject 2.4 mg into the skin once a week.  Dispense: 3 mL; Refill: 1  Essential hypertension Assessment & Plan: Her blood pressure is well-controlled.  She is currently on hydrochlorothiazide  and telmisartan .  She denies any orthostatic dizziness.  Continue current regimen and medically supervised weight management plan.  Monitor for orthostasis while losing weight   Class 2 severe obesity with serious comorbidity and body mass index (BMI) of 35.0 to 35.9 in adult, unspecified obesity type [facility]) Assessment & Plan: She has lost 32 pounds since April on medically supervised weight management program.  Please see obesity treatment plan  Orders: -     Wegovy ; Inject 2.4 mg into the skin once a week.  Dispense: 3 mL; Refill:  1     Objective   Physical Exam:  Blood pressure 130/74, pulse 65, temperature 97.7 F (36.5 C), height 5' 8 (1.727 m), weight 232 lb (105.2 kg), SpO2 100%. Body mass index is 35.28 kg/m.  General: She is overweight, cooperative, alert, well developed, and in no acute distress. PSYCH: Has normal mood, affect and thought process.   HEENT: EOMI, sclerae are anicteric. Lungs: Normal breathing effort, no conversational dyspnea. Extremities: No edema.  Neurologic: No gross sensory or motor deficits. No tremors or fasciculations noted.    Diagnostic Data Reviewed:  BMET    Component Value Date/Time   NA 144 12/10/2022 1654   K 4.1 12/10/2022 1654   CL 103 12/10/2022 1654   CO2 25 12/10/2022 1654   GLUCOSE 88 12/10/2022 1654   BUN 10 12/10/2022 1654   CREATININE 0.78 12/10/2022 1654   CALCIUM 9.7 12/10/2022 1654   GFRNONAA 82 11/10/2020 1045   GFRAA 94 11/10/2020 1045   Lab Results  Component Value Date   HGBA1C 5.6 07/25/2023   HGBA1C 5.8 04/18/2018   Lab Results  Component Value Date   INSULIN  22.4 12/26/2022   INSULIN  5.6 07/12/2020   Lab Results  Component Value Date   TSH 1.110 12/26/2022   CBC    Component Value Date/Time   WBC 8.3 12/10/2022 1654  RBC 5.02 12/10/2022 1654   HGB 14.3 12/10/2022 1654   HCT 42.2 12/10/2022 1654   PLT 279 12/10/2022 1654   MCV 84 12/10/2022 1654   MCH 28.5 12/10/2022 1654   MCHC 33.9 12/10/2022 1654   RDW 12.9 12/10/2022 1654   Iron Studies No results found for: IRON, TIBC, FERRITIN, IRONPCTSAT Lipid Panel     Component Value Date/Time   CHOL 126 12/10/2022 1654   TRIG 97 12/10/2022 1654   HDL 55 12/10/2022 1654   CHOLHDL 2.3 12/10/2022 1654   LDLCALC 53 12/10/2022 1654   Hepatic Function Panel     Component Value Date/Time   PROT 8.0 12/10/2022 1654   ALBUMIN 4.7 12/10/2022 1654   AST 20 12/10/2022 1654   ALT 21 12/10/2022 1654   ALKPHOS 84 12/10/2022 1654   BILITOT 0.4 12/10/2022 1654       Component Value Date/Time   TSH 1.110 12/26/2022 0927   Nutritional Lab Results  Component Value Date   VD25OH 45.4 07/25/2023   VD25OH 44.1 12/10/2022   VD25OH 45.2 11/14/2021    Follow-Up   Return in about 4 weeks (around 10/22/2023) for For Weight Mangement with Dr. Francyne.Jane Armstrong She was informed of the importance of frequent follow up visits to maximize her success with intensive lifestyle modifications for her multiple health conditions.  Attestation Statement   Reviewed by clinician on day of visit: allergies, medications, problem list, medical history, surgical history, family history, social history, and previous encounter notes.     Lucas Francyne, MD

## 2023-09-24 NOTE — Assessment & Plan Note (Signed)
 Improved on incretin therapy. She had increased orexigenic signaling, impaired satiety and inhibitory control. This is secondary to an abnormal energy regulation system and pathological neurohormonal pathways characteristic of excess adiposity.  In addition to nutritional and behavioral strategies she benefits from ongoing pharmacotherapy Wegovy .

## 2023-09-25 ENCOUNTER — Ambulatory Visit: Payer: BC Managed Care – PPO | Admitting: Nurse Practitioner

## 2023-10-01 ENCOUNTER — Ambulatory Visit: Payer: BC Managed Care – PPO | Admitting: Nurse Practitioner

## 2023-10-01 VITALS — BP 128/72 | HR 85 | Temp 98.1°F | Ht 68.0 in | Wt 234.0 lb

## 2023-10-01 DIAGNOSIS — I10 Essential (primary) hypertension: Secondary | ICD-10-CM | POA: Diagnosis not present

## 2023-10-01 DIAGNOSIS — Z23 Encounter for immunization: Secondary | ICD-10-CM | POA: Diagnosis not present

## 2023-10-01 DIAGNOSIS — Z6835 Body mass index (BMI) 35.0-35.9, adult: Secondary | ICD-10-CM | POA: Diagnosis not present

## 2023-10-01 DIAGNOSIS — E66812 Obesity, class 2: Secondary | ICD-10-CM | POA: Diagnosis not present

## 2023-10-01 MED ORDER — ERGOCALCIFEROL 1.25 MG (50000 UT) PO CAPS: 50000.0000 [IU] | ORAL_CAPSULE | ORAL | 5 refills | Status: DC

## 2023-10-01 MED ORDER — CETIRIZINE HCL 10 MG PO TABS: 10.0000 mg | ORAL_TABLET | Freq: Every day | ORAL | 1 refills | Status: DC

## 2023-10-01 NOTE — Progress Notes (Signed)
 LILLETTE Kristeen JINNY Gladis, CMA,acting as a neurosurgeon for Gaines Ada, FNP.,have documented all relevant documentation on the behalf of Gaines Ada, FNP,as directed by  Gaines Ada, FNP while in the presence of Gaines Ada, FNP.  Subjective:  Patient ID: Jane Armstrong , female    DOB: [birth year] , 53 y.o.   MRN: [id number]  Chief Complaint  Patient presents with   Hypertension    HPI  Patient presents today for a bp follow up, Patient reports compliance with medication. Patient denies any chest pain, SOB, or headaches. Patient has no concerns today. Continues to go to healthy weight and wellness. She is on Wegovy  and 1200 calorie diet. The patient ideal weight is 190 lbs.     Past Medical History:  Diagnosis Date   Hypertension    Iron deficiency anemia 12/10/2022   Obesity      Family History  Problem Relation Age of Onset   Hypertension Mother    Diabetes Mother    Breast cancer Mother 41   Heart disease Mother    Kidney disease Mother    Sleep apnea Mother    Obesity Mother      Current Outpatient Medications:    EPINEPHrine  0.3 mg/0.3 mL IJ SOAJ injection, Inject 0.3 mg into the muscle as needed for anaphylaxis. Use as directed, Disp: 1 each, Rfl: 3   hydrochlorothiazide  (HYDRODIURIL ) 25 MG tablet, TAKE 1 TABLET(25 MG) BY MOUTH DAILY, Disp: 90 tablet, Rfl: 1   ibuprofen  (ADVIL ) 800 MG tablet, TAKE 1 TABLET BY MOUTH 3 TIMES A DAY WITH FOOD AS NEEDED, Disp: 90 tablet, Rfl: 0   Semaglutide -Weight Management (WEGOVY ) 2.4 MG/0.75ML SOAJ, Inject 2.4 mg into the skin once a week., Disp: 3 mL, Rfl: 1   telmisartan  (MICARDIS ) 40 MG tablet, TAKE 1 TABLET(40 MG) BY MOUTH DAILY, Disp: 90 tablet, Rfl: 1   cetirizine  (ZYRTEC ) 10 MG tablet, Take 1 tablet (10 mg total) by mouth daily., Disp: 90 tablet, Rfl: 1   ergocalciferol  (VITAMIN D2) 1.25 MG (50000 UT) capsule, Take 1 capsule (50,000 Units total) by mouth once a week., Disp: 4 capsule, Rfl: 5   Allergies  Allergen Reactions   Bee  Venom Shortness Of Breath   Clindamycin/Lincomycin Hives   Latex Itching and Rash     Review of Systems  Constitutional: Negative.   Respiratory: Negative.    Cardiovascular: Negative.  Negative for chest pain, palpitations and leg swelling.  Gastrointestinal: Negative.   Neurological: Negative.   Psychiatric/Behavioral: Negative.       Today's Vitals   10/01/23 1600  BP: 128/72  Pulse: 85  Temp: 98.1 F (36.7 C)  TempSrc: Oral  Weight: 234 lb (106.1 kg)  Height: 5' 8 (1.727 m)  PainSc: 0-No pain   Body mass index is 35.58 kg/m.  Wt Readings from Last 3 Encounters:  10/01/23 234 lb (106.1 kg)  09/24/23 232 lb (105.2 kg)  08/19/23 236 lb (107 kg)       02/14/2023    3:11 PM 12/10/2022    3:47 PM 07/18/2022    9:57 AM 11/14/2021    8:36 AM 07/12/2020    3:13 PM  Depression screen PHQ 2/9  Decreased Interest 2 0 0 0 0  Down, Depressed, Hopeless 0 0 0 0 0  PHQ - 2 Score 2 0 0 0 0  Altered sleeping 0   0   Tired, decreased energy 2   0   Change in appetite 0   0   Feeling bad or  failure about yourself  0   0   Trouble concentrating 1   0   Moving slowly or fidgety/restless 0   0   Suicidal thoughts 0   0   PHQ-9 Score 5   0   Difficult doing work/chores Not difficult at all   Not difficult at all      Objective:  Physical Exam Vitals reviewed.  Constitutional:      General: She is not in acute distress.    Appearance: Normal appearance. She is obese.  Cardiovascular:     Rate and Rhythm: Normal rate and regular rhythm.     Pulses: Normal pulses.     Heart sounds: Normal heart sounds. No murmur heard. Pulmonary:     Effort: Pulmonary effort is normal. No respiratory distress.     Breath sounds: Normal breath sounds. No wheezing.  Skin:    General: Skin is warm and dry.  Neurological:     General: No focal deficit present.     Mental Status: She is alert and oriented to person, place, and time.     Cranial Nerves: No cranial nerve deficit.     Motor: No  weakness.  Psychiatric:        Mood and Affect: Mood normal.        Behavior: Behavior normal.        Thought Content: Thought content normal.        Judgment: Judgment normal.         Assessment And Plan:  Essential hypertension Assessment & Plan: Blood pressure is well controlled, continue current medications  Orders: -     BMP8+eGFR -     Ergocalciferol ; Take 1 capsule (50,000 Units total) by mouth once a week.  Dispense: 4 capsule; Refill: 5  COVID-19 vaccine administered Assessment & Plan: Covid 19 vaccine given in office observed for 15 minutes without any adverse reaction   Orders: -     Pfizer Comirnaty Covid-19 Vaccine 45yrs & older  Class 2 severe obesity due to excess calories with serious comorbidity and body mass index (BMI) of 35.0 to 35.9 in adult [facility]) Assessment & Plan: She is encouraged to strive for BMI less than 30 to decrease cardiac risk. Advised to aim for at least 150 minutes of exercise per week.  She continues to go to [employer] [facility].     Other orders -     Cetirizine  HCl; Take 1 tablet (10 mg total) by mouth daily.  Dispense: 90 tablet; Refill: 1    No follow-ups on file.  Patient was given opportunity to ask questions. Patient verbalized understanding of the plan and was able to repeat key elements of the plan. All questions were answered to their satisfaction.    LILLETTE Gaines Ada, FNP, have reviewed all documentation for this visit. The documentation on 10/01/23 for the exam, diagnosis, procedures, and orders are all accurate and complete.   IF YOU HAVE BEEN REFERRED TO A SPECIALIST, IT MAY TAKE 1-2 WEEKS TO SCHEDULE/PROCESS THE REFERRAL. IF YOU HAVE NOT HEARD FROM US /SPECIALIST IN TWO WEEKS, PLEASE GIVE US  A CALL AT [phone].

## 2023-10-02 LAB — BMP8+EGFR
BUN/Creatinine Ratio: 13 (ref 9–23)
BUN: 12 mg/dL (ref 6–24)
CO2: 27 mmol/L (ref 20–29)
Calcium: 9.8 mg/dL (ref 8.7–10.2)
Chloride: 101 mmol/L (ref 96–106)
Creatinine, Ser: 0.92 mg/dL (ref 0.57–1.00)
Glucose: 85 mg/dL (ref 70–99)
Potassium: 4.3 mmol/L (ref 3.5–5.2)
Sodium: 142 mmol/L (ref 134–144)
eGFR: 75 mL/min/{1.73_m2} (ref >59)

## 2023-10-10 DIAGNOSIS — Z23 Encounter for immunization: Secondary | ICD-10-CM | POA: Insufficient documentation

## 2023-10-10 HISTORY — DX: Encounter for immunization: Z23

## 2023-10-10 NOTE — Assessment & Plan Note (Signed)
Blood pressure is well controlled, continue current medications.

## 2023-10-10 NOTE — Assessment & Plan Note (Signed)
Covid 19 vaccine given in office observed for 15 minutes without any adverse reaction  

## 2023-10-10 NOTE — Assessment & Plan Note (Signed)
She is encouraged to strive for BMI less than 30 to decrease cardiac risk. Advised to aim for at least 150 minutes of exercise per week.  She continues to go to [employer] [facility].

## 2023-10-22 ENCOUNTER — Ambulatory Visit (INDEPENDENT_AMBULATORY_CARE_PROVIDER_SITE_OTHER): Payer: BC Managed Care – PPO | Admitting: Internal Medicine

## 2023-10-22 ENCOUNTER — Other Ambulatory Visit (HOSPITAL_COMMUNITY): Payer: Self-pay

## 2023-10-22 ENCOUNTER — Encounter (INDEPENDENT_AMBULATORY_CARE_PROVIDER_SITE_OTHER): Payer: Self-pay | Admitting: Internal Medicine

## 2023-10-22 DIAGNOSIS — R638 Other symptoms and signs concerning food and fluid intake: Secondary | ICD-10-CM | POA: Diagnosis not present

## 2023-10-22 DIAGNOSIS — R7303 Prediabetes: Secondary | ICD-10-CM

## 2023-10-22 DIAGNOSIS — E66812 Obesity, class 2: Secondary | ICD-10-CM

## 2023-10-22 DIAGNOSIS — Z6835 Body mass index (BMI) 35.0-35.9, adult: Secondary | ICD-10-CM | POA: Diagnosis not present

## 2023-10-22 MED ORDER — WEGOVY 2.4 MG/0.75ML ~~LOC~~ SOAJ
2.4000 mg | SUBCUTANEOUS | 1 refills | Status: DC
  Filled 2023-10-22: qty 3, 28d supply, fill #0
  Filled 2023-11-14: qty 3, 28d supply, fill #1

## 2023-10-22 NOTE — Progress Notes (Signed)
 Office: [phone]  /  Fax: [phone]  Weight Summary And Biometrics  Vitals Temp: 98.1 F (36.7 C) BP: 108/73 Pulse Rate: 75 SpO2: 99 %   Anthropometric Measurements Height: 5' 8 (1.727 m) Weight: 233 lb (105.7 kg) BMI (Calculated): 35.44 Weight at Last Visit: 232 lb Weight Lost Since Last Visit: 0 lb Weight Gained Since Last Visit: 1 lb Starting Weight: 260 lb Total Weight Loss (lbs): 27 lb (12.2 kg) Peak Weight: 269 lb   Body Composition  Body Fat %: 41.8 % Fat Mass (lbs): 97.4 lbs Muscle Mass (lbs): 128.8 lbs Total Body Water (lbs): 87.2 lbs Visceral Fat Rating : 11    No data recorded Today's Visit #: 10  Starting Date: 12/17/22   Subjective   Chief Complaint: Obesity  Jane Armstrong is here to discuss her progress with her obesity treatment plan. She is on the the Category 3 Plan and states she is following her eating plan approximately 70 % of the time. She states she is exercising 90 minutes 2 times per week.  Weight Progress Since Last Visit:  Discussed the use of AI scribe software for clinical note transcription with the patient, who gave verbal consent to proceed.  History of Present Illness   Jane Armstrong is a 53 year old female with prediabetes who presents for medical weight management.  She has experienced a slight weight gain of one pound over the past month, which she attributes to a hectic work schedule that disrupted her exercise routine. This disruption led to increased cravings and consumption of carbohydrates, particularly during her menstrual cycle. Her diet is currently protein-rich, and she is conscious of her carbohydrate intake to avoid feeling sluggish. Despite the slight weight gain, she notes changes in her body composition, as her clothes fit differently.   She is taking Wegovy  which she feels is effective, though she experiences occasional nausea. No skipped meals, but she admits to consuming more processed  carbohydrates, which she believes contributed to her weight gain.  She reports good hydration and adequate sleep, averaging seven and a half to eight hours per night. Her sleep quality is good, although she sometimes opts to sleep in rather than exercise in the morning. She plans to return to her regular routine now that her work schedule has calmed down.       Challenges affecting patient progress: none.   Orexigenic Control: Denies problems with appetite and hunger signals.  Denies problems with satiety and satiation.  Denies problems with eating patterns and portion control.  Reports abnormal cravings for sweets Denies feeling deprived or restricted.   Pharmacotherapy for weight management: She is currently taking Wegovy  with adequate clinical response  and without side effects..   Assessment and Plan   Treatment Plan For Obesity:  Recommended Dietary Goals  Jane Armstrong is currently in the action stage of change. As such, her goal is to continue weight management plan. She has agreed to: continue current plan  Behavioral Health and Counseling  We discussed the following behavioral modification strategies today: continue to work on maintaining a reduced calorie state, getting the recommended amount of protein, incorporating whole foods, making healthy choices, staying well hydrated and practicing mindfulness when eating..  Additional education and resources provided today: None  Recommended Physical Activity Goals  Jane Armstrong has been advised to work up to 150 minutes of moderate intensity aerobic activity a week and strengthening exercises 2-3 times per week for cardiovascular health, weight loss maintenance and preservation of muscle mass.  She has agreed to :  Think about enjoyable ways to increase daily physical activity and overcoming barriers to exercise and Increase physical activity in their day and reduce sedentary time (increase NEAT).  Pharmacotherapy  We discussed  various medication options to help Jane Armstrong with her weight loss efforts and we both agreed to : adequate clinical response to current dose, continue current regimen  Associated Conditions Impacted by Obesity Treatment  Prediabetes -     Wegovy ; Inject 2.4 mg into the skin once a week.  Dispense: 3 mL; Refill: 1  Abnormal food appetite -     Wegovy ; Inject 2.4 mg into the skin once a week.  Dispense: 3 mL; Refill: 1  Class 2 severe obesity with serious comorbidity and body mass index (BMI) of 35.0 to 35.9 in adult, unspecified obesity type (HCC) -     Wegovy ; Inject 2.4 mg into the skin once a week.  Dispense: 3 mL; Refill: 1    Assessment and Plan    Obesity /abnormal food appetite Patient reports a 1-pound weight gain over the past month due to decreased physical activity and increased carbohydrate intake related to work stress and menstrual cravings. She remains compliant with protein intake and hydration but has consumed more processed carbs. Her metabolic rate has decreased due to significant weight loss, necessitating recalibration of caloric intake. Current intake is around 1500 calories, which may need adjustment if weight loss plateaus. Discussed the need to maintain a consistent routine and potentially lower caloric intake further as weight decreases. - Reinforce adherence to a 1500 calorie diet - Monitor weight and adjust caloric intake if weight loss slows - Encourage resumption of regular physical activity - Refill Wegovy  prescription  Prediabetes Last A1c was 5.6%, down from 5.7% in November, indicating improved glycemic control. Patient understands the impact of carbohydrate intake on insulin  levels and weight loss. Continued monitoring and dietary adjustments are necessary to prevent progression to diabetes. Discussed the role of insulin  in weight management and the importance of a low carbohydrate diet. - Continue monitoring A1c levels -Continue Wegovy  for  pharmacoprophylaxis - Encourage low carbohydrate diet to manage insulin  levels   Follow-up - Schedule follow-up appointment in one month.       Objective   Physical Exam:  Blood pressure 108/73, pulse 75, temperature 98.1 F (36.7 C), height 5' 8 (1.727 m), weight 233 lb (105.7 kg), last menstrual period 10/19/2023, SpO2 99%. Body mass index is 35.43 kg/m.  General: She is overweight, cooperative, alert, well developed, and in no acute distress. PSYCH: Has normal mood, affect and thought process.   HEENT: EOMI, sclerae are anicteric. Lungs: Normal breathing effort, no conversational dyspnea. Extremities: No edema.  Neurologic: No gross sensory or motor deficits. No tremors or fasciculations noted.    Diagnostic Data Reviewed:  BMET    Component Value Date/Time   NA 142 10/01/2023 1645   K 4.3 10/01/2023 1645   CL 101 10/01/2023 1645   CO2 27 10/01/2023 1645   GLUCOSE 85 10/01/2023 1645   BUN 12 10/01/2023 1645   CREATININE 0.92 10/01/2023 1645   CALCIUM 9.8 10/01/2023 1645   GFRNONAA 82 11/10/2020 1045   GFRAA 94 11/10/2020 1045   Lab Results  Component Value Date   HGBA1C 5.6 07/25/2023   HGBA1C 5.8 04/18/2018   Lab Results  Component Value Date   INSULIN  22.4 12/26/2022   INSULIN  5.6 07/12/2020   Lab Results  Component Value Date   TSH 1.110 12/26/2022   CBC  Component Value Date/Time   WBC 8.3 12/10/2022 1654   RBC 5.02 12/10/2022 1654   HGB 14.3 12/10/2022 1654   HCT 42.2 12/10/2022 1654   PLT 279 12/10/2022 1654   MCV 84 12/10/2022 1654   MCH 28.5 12/10/2022 1654   MCHC 33.9 12/10/2022 1654   RDW 12.9 12/10/2022 1654   Iron Studies No results found for: IRON, TIBC, FERRITIN, IRONPCTSAT Lipid Panel     Component Value Date/Time   CHOL 126 12/10/2022 1654   TRIG 97 12/10/2022 1654   HDL 55 12/10/2022 1654   CHOLHDL 2.3 12/10/2022 1654   LDLCALC 53 12/10/2022 1654   Hepatic Function Panel     Component Value Date/Time    PROT 8.0 12/10/2022 1654   ALBUMIN 4.7 12/10/2022 1654   AST 20 12/10/2022 1654   ALT 21 12/10/2022 1654   ALKPHOS 84 12/10/2022 1654   BILITOT 0.4 12/10/2022 1654      Component Value Date/Time   TSH 1.110 12/26/2022 0927   Nutritional Lab Results  Component Value Date   VD25OH 45.4 07/25/2023   VD25OH 44.1 12/10/2022   VD25OH 45.2 11/14/2021    Follow-Up   Return in about 4 weeks (around 11/19/2023) for For Weight Mangement with Dr. Francyne.Jane Armstrong She was informed of the importance of frequent follow up visits to maximize her success with intensive lifestyle modifications for her multiple health conditions.  Attestation Statement   Reviewed by clinician on day of visit: allergies, medications, problem list, medical history, surgical history, family history, social history, and previous encounter notes.     Lucas Francyne, MD

## 2023-11-19 ENCOUNTER — Ambulatory Visit (INDEPENDENT_AMBULATORY_CARE_PROVIDER_SITE_OTHER): Payer: BC Managed Care – PPO | Admitting: Internal Medicine

## 2023-11-23 ENCOUNTER — Encounter: Payer: Self-pay | Admitting: Nurse Practitioner

## 2023-11-25 ENCOUNTER — Ambulatory Visit (INDEPENDENT_AMBULATORY_CARE_PROVIDER_SITE_OTHER): Payer: BC Managed Care – PPO | Admitting: Internal Medicine

## 2023-11-25 ENCOUNTER — Encounter (INDEPENDENT_AMBULATORY_CARE_PROVIDER_SITE_OTHER): Payer: Self-pay | Admitting: Internal Medicine

## 2023-11-25 VITALS — BP 111/73 | HR 91 | Temp 98.1°F | Ht 68.0 in | Wt 227.0 lb

## 2023-11-25 DIAGNOSIS — R638 Other symptoms and signs concerning food and fluid intake: Secondary | ICD-10-CM | POA: Diagnosis not present

## 2023-11-25 DIAGNOSIS — R7303 Prediabetes: Secondary | ICD-10-CM

## 2023-11-25 DIAGNOSIS — E66812 Obesity, class 2: Secondary | ICD-10-CM | POA: Diagnosis not present

## 2023-11-25 DIAGNOSIS — Z6835 Body mass index (BMI) 35.0-35.9, adult: Secondary | ICD-10-CM

## 2023-11-25 DIAGNOSIS — I10 Essential (primary) hypertension: Secondary | ICD-10-CM | POA: Diagnosis not present

## 2023-11-25 NOTE — Assessment & Plan Note (Signed)
 She has lost 37 pounds or 15% of total body weight.  Her BIA shows a reduction in body fat and preservation of muscle mass.  She is currently on Wegovy 2.4 mg once a week without any adverse effects.  Continue current weight management strategy

## 2023-11-25 NOTE — Assessment & Plan Note (Signed)
 Improved significantly on GLP-1. She had increased orexigenic signaling, impaired satiety and inhibitory control. This is secondary to an abnormal energy regulation system and pathological neurohormonal pathways characteristic of excess adiposity.  In addition to nutritional and behavioral strategies she benefits from ongoing pharmacotherapy Wegovy.

## 2023-11-25 NOTE — Progress Notes (Signed)
 Office: [phone]  /  Fax: [phone]  Weight Summary And Biometrics  Vitals Temp: 98.1 F (36.7 C) BP: 111/73 Pulse Rate: 91 SpO2: 99 %   Anthropometric Measurements Height: 5\' 8"  (1.727 m) Weight: 227 lb (103 kg) BMI (Calculated): 34.52 Weight at Last Visit: 233 lb Weight Lost Since Last Visit: 6 lb Weight Gained Since Last Visit: 0 lb Starting Weight: 260 lb Total Weight Loss (lbs): 33 lb (15 kg) Peak Weight: 269 lb   Body Composition  Body Fat %: 40.6 % Fat Mass (lbs): 92.2 lbs Muscle Mass (lbs): 128 lbs Total Body Water (lbs): 86 lbs Visceral Fat Rating : 11    No data recorded Today's Visit #: 11  Starting Date: 12/17/22   Subjective   Chief Complaint: Obesity  Jane Armstrong is here to discuss her progress with her obesity treatment plan. She is on the the Category 3 Plan and states she is following her eating plan approximately 90% of the time. She states she is exercising 90 minutes minutes 3 times per week.  Weight Progress Since Last Visit:  Since last office visit she has lost 6 pounds. She reports good adherence to reduced calorie nutritional plan. She has been working on reading food labels, not skipping meals, increasing protein intake at every meal, drinking more water, making healthier choices, reducing portion sizes, and incorporating more whole foods   Challenges affecting patient progress: none.   Orexigenic Control: Denies problems with appetite and hunger signals.  Denies problems with satiety and satiation.  Denies problems with eating patterns and portion control.  Denies abnormal cravings. Denies feeling deprived or restricted.   Pharmacotherapy for weight management: She is currently taking Wegovy with adequate clinical response  and without side effects..   Assessment and Plan   Treatment Plan For Obesity:  Recommended Dietary Goals  Jane Armstrong is currently in the action stage of change. As such, her goal is to continue  weight management plan. She has agreed to: continue current plan  Behavioral Health and Counseling  We discussed the following behavioral modification strategies today: continue to work on maintaining a reduced calorie state, getting the recommended amount of protein, incorporating whole foods, making healthy choices, staying well hydrated and practicing mindfulness when eating..  Additional education and resources provided today: None  Recommended Physical Activity Goals  Jane Armstrong has been advised to work up to 150 minutes of moderate intensity aerobic activity a week and strengthening exercises 2-3 times per week for cardiovascular health, weight loss maintenance and preservation of muscle mass.   She has agreed to :  Think about enjoyable ways to increase daily physical activity and overcoming barriers to exercise and Increase physical activity in their day and reduce sedentary time (increase NEAT).  Pharmacotherapy  We discussed various medication options to help Jane Armstrong with her weight loss efforts and we both agreed to : adequate clinical response to current dose, continue current regimen  Associated Conditions Impacted by Obesity Treatment    Objective   Physical Exam:  Blood pressure 111/73, pulse 91, temperature 98.1 F (36.7 C), height 5\' 8"  (1.727 m), weight 227 lb (103 kg), SpO2 99%. Body mass index is 34.52 kg/m.  General: She is overweight, cooperative, alert, well developed, and in no acute distress. PSYCH: Has normal mood, affect and thought process.   HEENT: EOMI, sclerae are anicteric. Lungs: Normal breathing effort, no conversational dyspnea. Extremities: No edema.  Neurologic: No gross sensory or motor deficits. No tremors or fasciculations noted.    Diagnostic  Data Reviewed:  BMET    Component Value Date/Time   NA 142 10/01/2023 1645   K 4.3 10/01/2023 1645   CL 101 10/01/2023 1645   CO2 27 10/01/2023 1645   GLUCOSE 85 10/01/2023 1645   BUN 12  10/01/2023 1645   CREATININE 0.92 10/01/2023 1645   CALCIUM 9.8 10/01/2023 1645   GFRNONAA 82 11/10/2020 1045   GFRAA 94 11/10/2020 1045   Lab Results  Component Value Date   HGBA1C 5.6 07/25/2023   HGBA1C 5.8 04/18/2018   Lab Results  Component Value Date   INSULIN 22.4 12/26/2022   INSULIN 5.6 07/12/2020   Lab Results  Component Value Date   TSH 1.110 12/26/2022   CBC    Component Value Date/Time   WBC 8.3 12/10/2022 1654   RBC 5.02 12/10/2022 1654   HGB 14.3 12/10/2022 1654   HCT 42.2 12/10/2022 1654   PLT 279 12/10/2022 1654   MCV 84 12/10/2022 1654   MCH 28.5 12/10/2022 1654   MCHC 33.9 12/10/2022 1654   RDW 12.9 12/10/2022 1654   Iron Studies No results found for: "IRON", "TIBC", "FERRITIN", "IRONPCTSAT" Lipid Panel     Component Value Date/Time   CHOL 126 12/10/2022 1654   TRIG 97 12/10/2022 1654   HDL 55 12/10/2022 1654   CHOLHDL 2.3 12/10/2022 1654   LDLCALC 53 12/10/2022 1654   Hepatic Function Panel     Component Value Date/Time   PROT 8.0 12/10/2022 1654   ALBUMIN 4.7 12/10/2022 1654   AST 20 12/10/2022 1654   ALT 21 12/10/2022 1654   ALKPHOS 84 12/10/2022 1654   BILITOT 0.4 12/10/2022 1654      Component Value Date/Time   TSH 1.110 12/26/2022 0927   Nutritional Lab Results  Component Value Date   VD25OH 45.4 07/25/2023   VD25OH 44.1 12/10/2022   VD25OH 45.2 11/14/2021    Follow-Up   Return in about 4 weeks (around 12/23/2023) for For Weight Mangement with Dr. Rikki Spearing.Marland Kitchen She was informed of the importance of frequent follow up visits to maximize her success with intensive lifestyle modifications for her multiple health conditions.  Attestation Statement   Reviewed by clinician on day of visit: allergies, medications, problem list, medical history, surgical history, family history, social history, and previous encounter notes.     Worthy Rancher, MD

## 2023-11-25 NOTE — Assessment & Plan Note (Signed)
 Her blood pressure is well-controlled.  She is currently on hydrochlorothiazide and telmisartan.  She denies any orthostatic dizziness.  Continue current regimen and medically supervised weight management plan.  Monitor for orthostasis while losing weight

## 2023-11-25 NOTE — Assessment & Plan Note (Signed)
 Most recent hemoglobin A1c is 5.6 and improved.  She had been as high as 5.9 in the past.  She is currently on Wegovy 2.4 mg once a week for pharmacoprophylaxis.  She will continue medication and medically supervised weight management plan

## 2023-11-25 NOTE — Progress Notes (Deleted)
 Office: [phone]  /  Fax: [phone]  Weight Summary And Biometrics  Vitals Temp: 98.1 F (36.7 C) BP: 111/73 Pulse Rate: 91 SpO2: 99 %   Anthropometric Measurements Height: 5\' 8"  (1.727 m) Weight: 227 lb (103 kg) BMI (Calculated): 34.52 Weight at Last Visit: 233 lb Weight Lost Since Last Visit: 6 lb Weight Gained Since Last Visit: 0 lb Starting Weight: 260 lb Total Weight Loss (lbs): 33 lb (15 kg) Peak Weight: 269 lb   Body Composition  Body Fat %: 40.6 % Fat Mass (lbs): 92.2 lbs Muscle Mass (lbs): 128 lbs Total Body Water (lbs): 86 lbs Visceral Fat Rating : 11    No data recorded Today's Visit #: 11  Starting Date: 12/17/22   Subjective   Chief Complaint: Obesity  Interval History Discussed the use of AI scribe software for clinical note transcription with the patient, who gave verbal consent to proceed.  History of Present Illness            Challenges affecting patient progress: {EMOBESITYBARRIERS:28841}.    Pharmacotherapy for weight management: She is currently taking {EMPharmaco:28845}.   Assessment and Plan   Treatment Plan For Obesity:  Recommended Dietary Goals  Jane Armstrong is currently in the action stage of change. As such, her goal is to continue weight management plan. She has agreed to: {EMWTLOSSPLAN:29297::"continue current plan"}  Behavioral Health and Counseling  We discussed the following behavioral modification strategies today: {EMWMwtlossstrategies:28914::"continue to work on maintaining a reduced calorie state, getting the recommended amount of protein, incorporating whole foods, making healthy choices, staying well hydrated and practicing mindfulness when eating."}.  Additional education and resources provided today: {EMadditionalresources:29169::"None"}  Recommended Physical Activity Goals  Jane Armstrong has been advised to work up to 150 minutes of moderate intensity aerobic activity a week and strengthening exercises  2-3 times per week for cardiovascular health, weight loss maintenance and preservation of muscle mass.   She has agreed to :  {EMEXERCISE:28847::"Think about enjoyable ways to increase daily physical activity and overcoming barriers to exercise","Increase physical activity in their day and reduce sedentary time (increase NEAT)."}  Pharmacotherapy  We discussed various medication options to help Jane Armstrong with her weight loss efforts and we both agreed to : {EMagreedrx:29170}  Associated Conditions Impacted by Obesity Treatment  Prediabetes  Abnormal food appetite  Class 2 severe obesity with serious comorbidity and body mass index (BMI) of 35.0 to 35.9 in adult, unspecified obesity type (HCC)    Assessment and Plan               Objective   Physical Exam:  Blood pressure 111/73, pulse 91, temperature 98.1 F (36.7 C), height 5\' 8"  (1.727 m), weight 227 lb (103 kg), SpO2 99%. Body mass index is 34.52 kg/m.  General: She is overweight, cooperative, alert, well developed, and in no acute distress. PSYCH: Has normal mood, affect and thought process.   HEENT: EOMI, sclerae are anicteric. Lungs: Normal breathing effort, no conversational dyspnea. Extremities: No edema.  Neurologic: No gross sensory or motor deficits. No tremors or fasciculations noted.    Diagnostic Data Reviewed:  BMET    Component Value Date/Time   NA 142 10/01/2023 1645   K 4.3 10/01/2023 1645   CL 101 10/01/2023 1645   CO2 27 10/01/2023 1645   GLUCOSE 85 10/01/2023 1645   BUN 12 10/01/2023 1645   CREATININE 0.92 10/01/2023 1645   CALCIUM 9.8 10/01/2023 1645   GFRNONAA 82 11/10/2020 1045   GFRAA 94 11/10/2020 1045   Lab  Results  Component Value Date   HGBA1C 5.6 07/25/2023   HGBA1C 5.8 04/18/2018   Lab Results  Component Value Date   INSULIN 22.4 12/26/2022   INSULIN 5.6 07/12/2020   Lab Results  Component Value Date   TSH 1.110 12/26/2022   CBC    Component Value Date/Time   WBC  8.3 12/10/2022 1654   RBC 5.02 12/10/2022 1654   HGB 14.3 12/10/2022 1654   HCT 42.2 12/10/2022 1654   PLT 279 12/10/2022 1654   MCV 84 12/10/2022 1654   MCH 28.5 12/10/2022 1654   MCHC 33.9 12/10/2022 1654   RDW 12.9 12/10/2022 1654   Iron Studies No results found for: "IRON", "TIBC", "FERRITIN", "IRONPCTSAT" Lipid Panel     Component Value Date/Time   CHOL 126 12/10/2022 1654   TRIG 97 12/10/2022 1654   HDL 55 12/10/2022 1654   CHOLHDL 2.3 12/10/2022 1654   LDLCALC 53 12/10/2022 1654   Hepatic Function Panel     Component Value Date/Time   PROT 8.0 12/10/2022 1654   ALBUMIN 4.7 12/10/2022 1654   AST 20 12/10/2022 1654   ALT 21 12/10/2022 1654   ALKPHOS 84 12/10/2022 1654   BILITOT 0.4 12/10/2022 1654      Component Value Date/Time   TSH 1.110 12/26/2022 0927   Nutritional Lab Results  Component Value Date   VD25OH 45.4 07/25/2023   VD25OH 44.1 12/10/2022   VD25OH 45.2 11/14/2021    Medications: Outpatient Encounter Medications as of 11/25/2023  Medication Sig   cetirizine (ZYRTEC) 10 MG tablet Take 1 tablet (10 mg total) by mouth daily.   EPINEPHrine 0.3 mg/0.3 mL IJ SOAJ injection Inject 0.3 mg into the muscle as needed for anaphylaxis. Use as directed   ergocalciferol (VITAMIN D2) 1.25 MG (50000 UT) capsule Take 1 capsule (50,000 Units total) by mouth once a week.   hydrochlorothiazide (HYDRODIURIL) 25 MG tablet TAKE 1 TABLET(25 MG) BY MOUTH DAILY   ibuprofen (ADVIL) 800 MG tablet TAKE 1 TABLET BY MOUTH 3 TIMES A DAY WITH FOOD AS NEEDED   Semaglutide-Weight Management (WEGOVY) 2.4 MG/0.75ML SOAJ Inject 2.4 mg into the skin once a week.   telmisartan (MICARDIS) 40 MG tablet TAKE 1 TABLET(40 MG) BY MOUTH DAILY   No facility-administered encounter medications on file as of 11/25/2023.     Follow-Up   No follow-ups on file.Marland Kitchen She was informed of the importance of frequent follow up visits to maximize her success with intensive lifestyle modifications for her  multiple health conditions.  Attestation Statement   Reviewed by clinician on day of visit: allergies, medications, problem list, medical history, surgical history, family history, social history, and previous encounter notes.     Worthy Rancher, MD

## 2023-11-30 IMAGING — MG DIGITAL DIAGNOSTIC BILAT W/ TOMO W/ CAD
8 of 16 series · 9 of 40 positions shown · non-contrast
Comparison: Previous exam(s).

CLINICAL DATA: Two year follow-up of a left breast asymmetry.

EXAM:
DIGITAL DIAGNOSTIC BILATERAL MAMMOGRAM WITH TOMOSYNTHESIS AND CAD
TECHNIQUE: Bilateral digital diagnostic mammography and breast tomosynthesis
was performed. The images were evaluated with computer-aided
detection.

[R MLO synth-2D (1 of 2)]
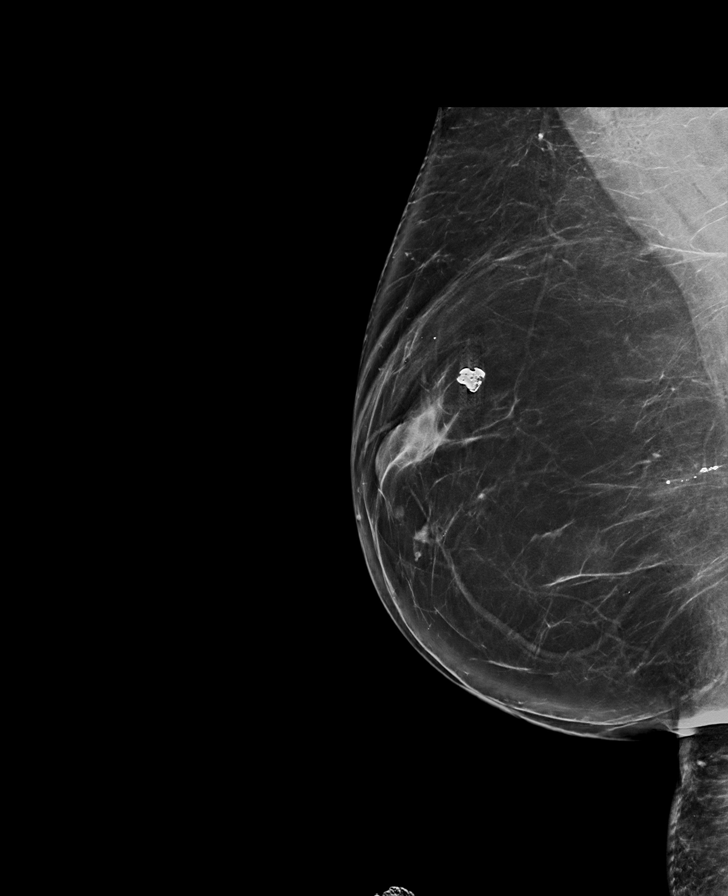

[L ML synth-2D]
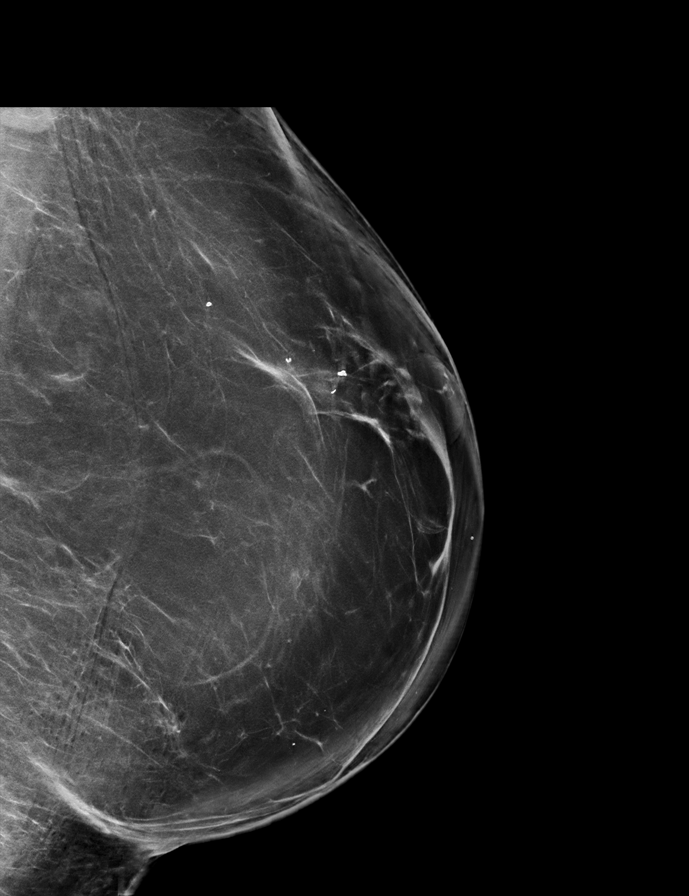

[L LM synth-2D]
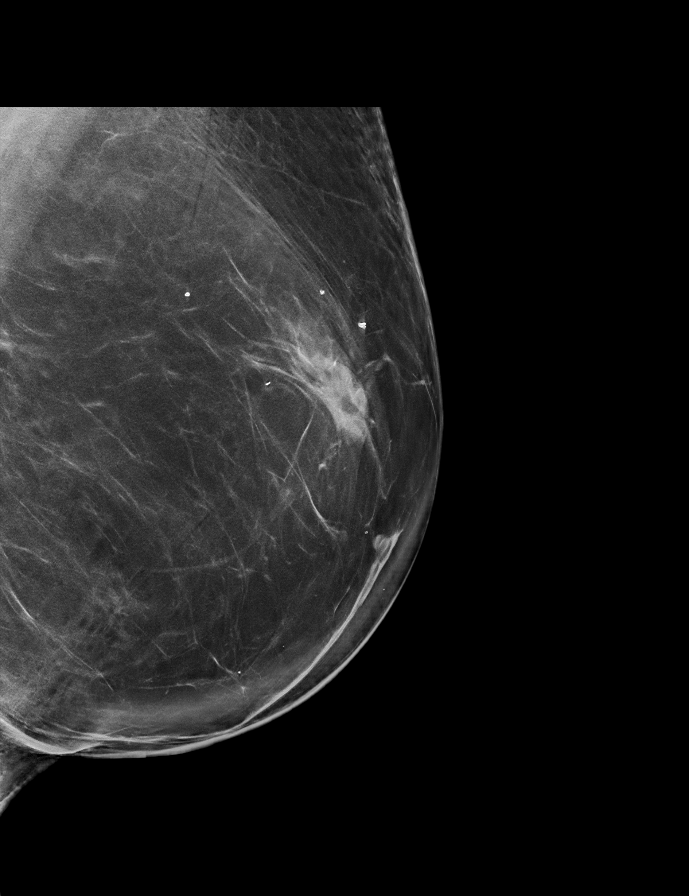

[R CC synth-2D]
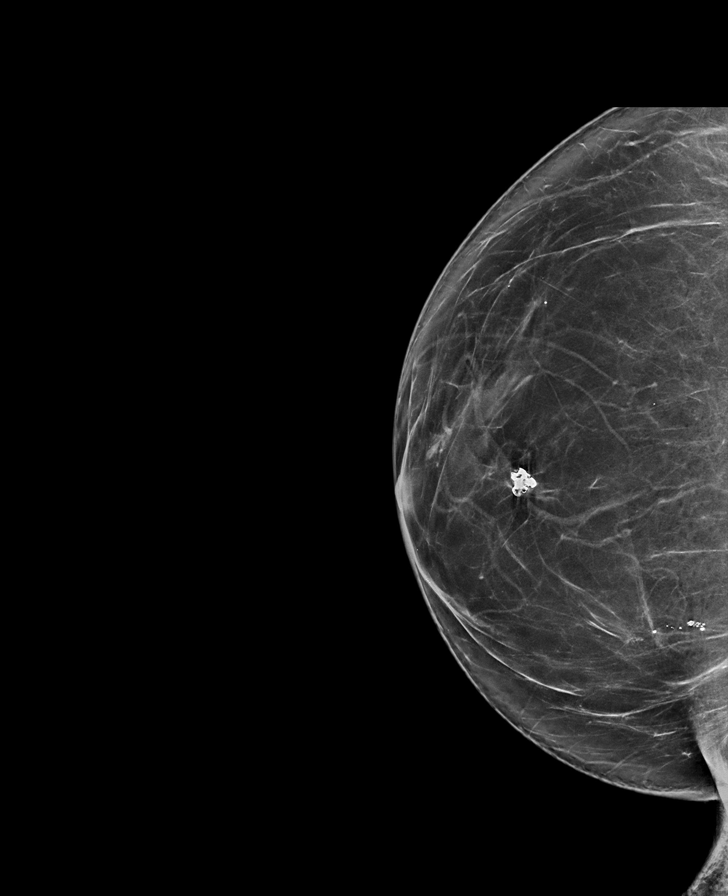

[L MLO synth-2D (1 of 2)]
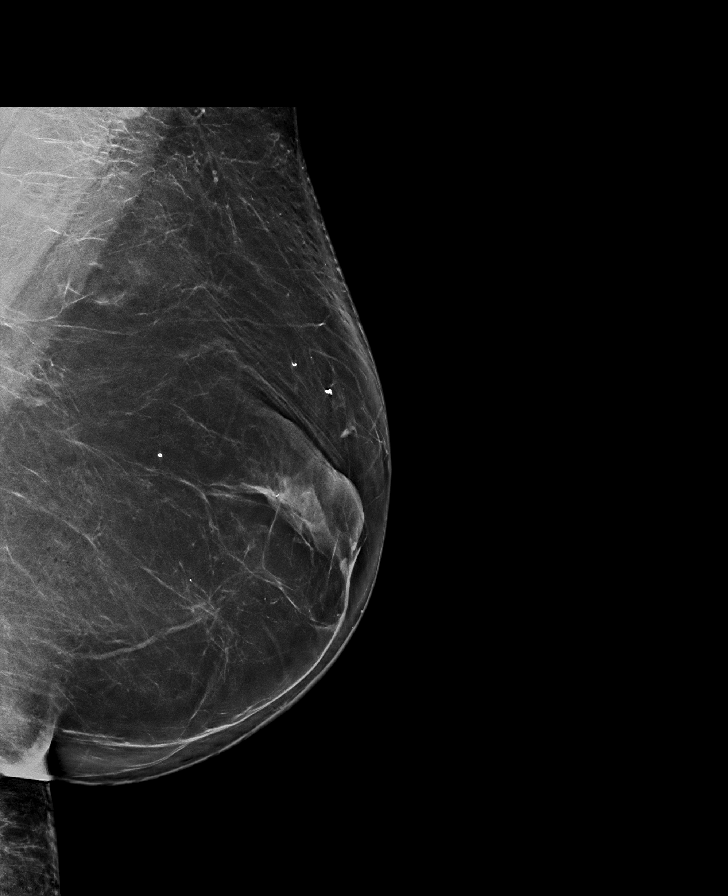

[R MLO synth-2D (2 of 2)]
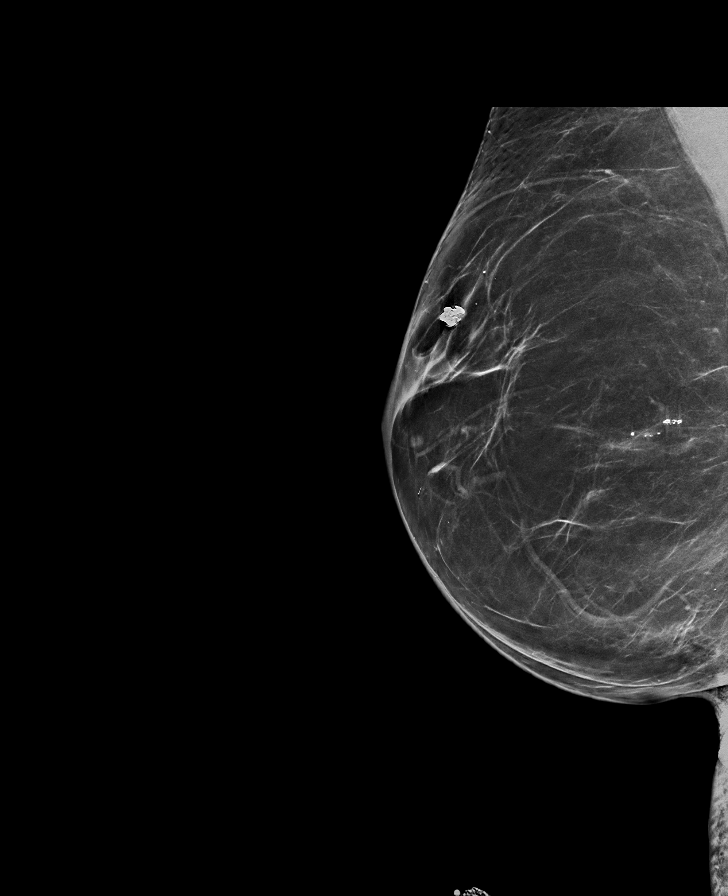

[L MLO synth-2D (2 of 2)]
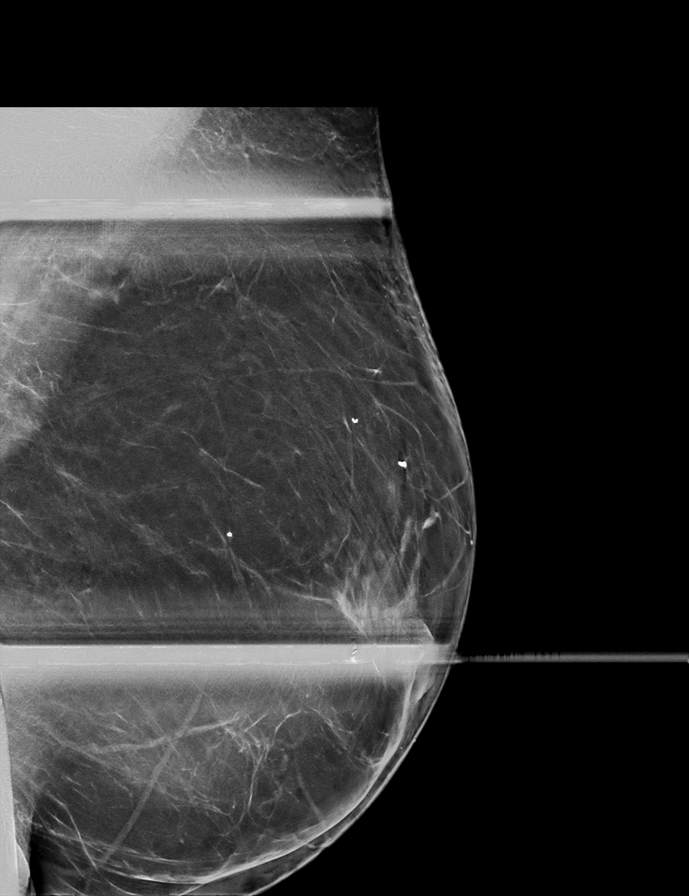

[L MLO tomo · 2 of 97 frames shown]
[frame 32/97]
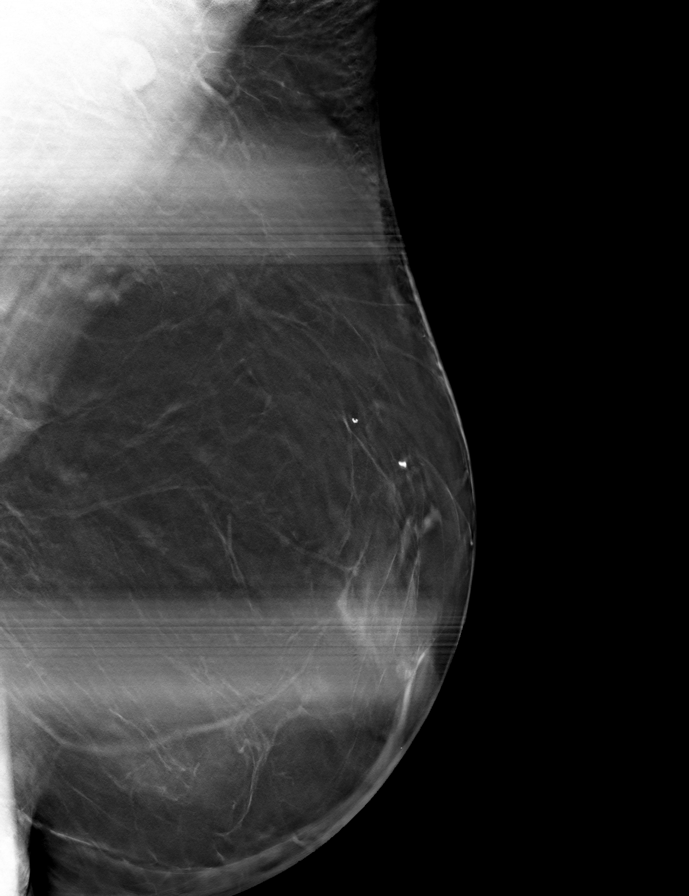
[frame 49/97]
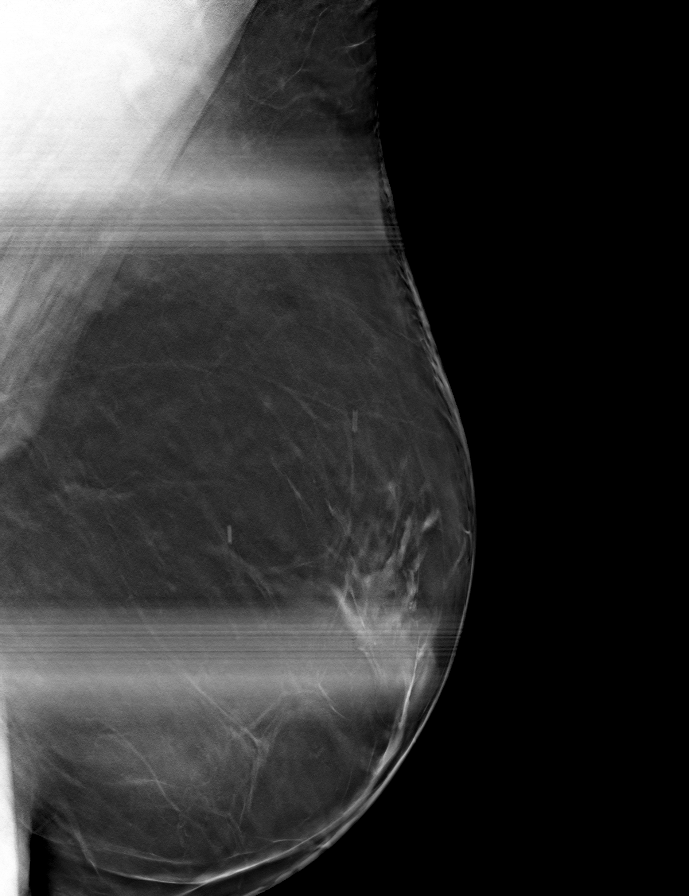

[9 of 40 positions shown; findings below may reference images not displayed]

ACR Breast Density Category b: There are scattered areas of
fibroglandular density.
FINDINGS: The left breast asymmetry is not significantly changed. No new or
suspicious findings in either breast.
IMPRESSION: No mammographic evidence of malignancy.

RECOMMENDATION:
Annual screening mammography.

I have discussed the findings and recommendations with the patient.
If applicable, a reminder letter will be sent to the patient
regarding the next appointment.

BI-RADS CATEGORY  2: Benign.

## 2023-12-10 ENCOUNTER — Encounter (INDEPENDENT_AMBULATORY_CARE_PROVIDER_SITE_OTHER): Payer: Self-pay | Admitting: Internal Medicine

## 2023-12-10 ENCOUNTER — Other Ambulatory Visit (INDEPENDENT_AMBULATORY_CARE_PROVIDER_SITE_OTHER): Payer: Self-pay

## 2023-12-10 ENCOUNTER — Other Ambulatory Visit (HOSPITAL_COMMUNITY): Payer: Self-pay

## 2023-12-10 DIAGNOSIS — R638 Other symptoms and signs concerning food and fluid intake: Secondary | ICD-10-CM

## 2023-12-10 DIAGNOSIS — R7303 Prediabetes: Secondary | ICD-10-CM

## 2023-12-10 MED ORDER — WEGOVY 2.4 MG/0.75ML ~~LOC~~ SOAJ
2.4000 mg | SUBCUTANEOUS | 1 refills | Status: DC
Start: 2023-12-10 — End: 2023-12-26
  Filled 2023-12-10 (×2): qty 3, 28d supply, fill #0

## 2023-12-10 NOTE — Telephone Encounter (Signed)
 Please review

## 2023-12-11 ENCOUNTER — Other Ambulatory Visit (HOSPITAL_COMMUNITY): Payer: Self-pay

## 2023-12-12 ENCOUNTER — Encounter: Payer: Self-pay | Admitting: Nurse Practitioner

## 2023-12-17 ENCOUNTER — Other Ambulatory Visit: Payer: Self-pay

## 2023-12-17 MED ORDER — HYDROCHLOROTHIAZIDE 25 MG PO TABS: 25.0000 mg | ORAL_TABLET | Freq: Every day | ORAL | 1 refills | Status: DC

## 2023-12-17 MED ORDER — TELMISARTAN 40 MG PO TABS: 40.0000 mg | ORAL_TABLET | Freq: Every day | ORAL | 1 refills | Status: DC

## 2023-12-24 DIAGNOSIS — Z1231 Encounter for screening mammogram for malignant neoplasm of breast: Secondary | ICD-10-CM | POA: Diagnosis not present

## 2023-12-24 DIAGNOSIS — Z01419 Encounter for gynecological examination (general) (routine) without abnormal findings: Secondary | ICD-10-CM | POA: Diagnosis not present

## 2023-12-24 DIAGNOSIS — Z124 Encounter for screening for malignant neoplasm of cervix: Secondary | ICD-10-CM | POA: Diagnosis not present

## 2023-12-24 LAB — HM MAMMOGRAPHY

## 2023-12-26 ENCOUNTER — Ambulatory Visit (INDEPENDENT_AMBULATORY_CARE_PROVIDER_SITE_OTHER): Admitting: Internal Medicine

## 2023-12-26 ENCOUNTER — Encounter (INDEPENDENT_AMBULATORY_CARE_PROVIDER_SITE_OTHER): Payer: Self-pay | Admitting: Internal Medicine

## 2023-12-26 ENCOUNTER — Other Ambulatory Visit (HOSPITAL_COMMUNITY): Payer: Self-pay

## 2023-12-26 VITALS — BP 107/75 | HR 82 | Temp 98.1°F | Ht 68.0 in | Wt 226.0 lb

## 2023-12-26 DIAGNOSIS — Z6835 Body mass index (BMI) 35.0-35.9, adult: Secondary | ICD-10-CM

## 2023-12-26 DIAGNOSIS — I10 Essential (primary) hypertension: Secondary | ICD-10-CM

## 2023-12-26 DIAGNOSIS — R7303 Prediabetes: Secondary | ICD-10-CM | POA: Diagnosis not present

## 2023-12-26 DIAGNOSIS — R638 Other symptoms and signs concerning food and fluid intake: Secondary | ICD-10-CM

## 2023-12-26 DIAGNOSIS — E66812 Obesity, class 2: Secondary | ICD-10-CM | POA: Diagnosis not present

## 2023-12-26 MED ORDER — WEGOVY 2.4 MG/0.75ML ~~LOC~~ SOAJ
2.4000 mg | SUBCUTANEOUS | 1 refills | Status: DC
  Filled 2023-12-26 – 2024-01-04 (×2): qty 3, 28d supply, fill #0

## 2023-12-26 MED ORDER — WEGOVY 2.4 MG/0.75ML ~~LOC~~ SOAJ
2.4000 mg | SUBCUTANEOUS | 1 refills | Status: DC
  Filled 2023-12-26: qty 3, 28d supply, fill #0

## 2023-12-26 NOTE — Assessment & Plan Note (Signed)
 Improved significantly on GLP-1. She had increased orexigenic signaling, impaired satiety and inhibitory control. This is secondary to an abnormal energy regulation system and pathological neurohormonal pathways characteristic of excess adiposity.  In addition to nutritional and behavioral strategies she benefits from ongoing pharmacotherapy Wegovy.

## 2023-12-26 NOTE — Assessment & Plan Note (Signed)
 Most recent hemoglobin A1c is 5.6 and improved.  She had been as high as 5.9 in the past.  She is currently on Wegovy 2.4 mg once a week for pharmacoprophylaxis.  She will continue medication and medically supervised weight management plan

## 2023-12-26 NOTE — Progress Notes (Signed)
 Office: [phone]  /  Fax: [phone]  Weight Summary And Biometrics  Vitals Temp: 98.1 F (36.7 C) BP: 107/75 Pulse Rate: 82 SpO2: 99 %   Anthropometric Measurements Height: 5\' 8"  (1.727 m) Weight: 226 lb (102.5 kg) BMI (Calculated): 34.37 Weight at Last Visit: 227 lb Weight Lost Since Last Visit: 1 lb Weight Gained Since Last Visit: 0 lb Starting Weight: 260 lb Total Weight Loss (lbs): 34 lb (15.4 kg) Peak Weight: 269 lb   Body Composition  Body Fat %: 40.8 % Fat Mass (lbs): 92.4 lbs Muscle Mass (lbs): 127.2 lbs Total Body Water (lbs): 84.4 lbs Visceral Fat Rating : 11    No data recorded Today's Visit #: 12  Starting Date: 12/17/22   Subjective   Chief Complaint: Obesity  Interval History Discussed the use of AI scribe software for clinical note transcription with the patient, who gave verbal consent to proceed.  History of Present Illness   Jane Armstrong is a 53 year old female with prediabetes and hypertension who presents for a follow-up on weight management and metabolic health.  She adheres to her dietary and exercise plan approximately 70-80% of the time, consuming more whole foods and an adequate amount of protein, around 30-40 grams per meal, without skipping meals. Her physical activity has decreased from three to four days a week to two days a week due to her work schedule, with sessions lasting 90 to 120 minutes, including strength training, cardio, and yoga.  Over the past month, she has maintained her muscle mass and body fat, losing one pound after a previous loss of six pounds from February to March. She attributes the slowed weight loss to increased carbohydrate intake, noting that 'once you started to get the carbs in, it's like you want it more.' She acknowledges overeating on carbohydrates for a few days, which she believes affected her weight loss rate.  Her blood pressure is well-controlled with hydrochlorothiazide and  telmisartan, and she has not experienced any lightheadedness or dizziness. She continues to take [facility] at 2.4 mg once a week, which has contributed to her weight loss of 43 pounds from a peak weight of 269 pounds.  Her last blood work in January showed stable kidney function, and her A1c improved from 5.7 to 5.6 since November.        Challenges affecting patient progress: none.    Pharmacotherapy for weight management: She is currently taking Wegovy with adequate clinical response  and without side effects..   Assessment and Plan   Treatment Plan For Obesity:  Recommended Dietary Goals  [name] is currently in the action stage of change. As such, her goal is to continue weight management plan. She has agreed to: continue current plan  Behavioral Health and Counseling  We discussed the following behavioral modification strategies today: continue to work on maintaining a reduced calorie state, getting the recommended amount of protein, incorporating whole foods, making healthy choices, staying well hydrated and practicing mindfulness when eating..  Additional education and resources provided today: None  Recommended Physical Activity Goals  [name] has been advised to work up to 150 minutes of moderate intensity aerobic activity a week and strengthening exercises 2-3 times per week for cardiovascular health, weight loss maintenance and preservation of muscle mass.   She has agreed to :  continue to gradually increase the amount and intensity of exercise routine  Pharmacotherapy  We discussed various medication options to help Nachelle with her weight loss efforts and we both agreed  to : adequate clinical response to current dose, continue current regimen  Associated Conditions Impacted by Obesity Treatment  Essential hypertension Assessment & Plan: Her blood pressure is well-controlled with hydrochlorothiazide and telmisartan, currently at 107/73, within the target range. She has  not experienced symptoms of hypotension such as lightheadedness or dizziness. She is advised to monitor for symptoms of hypotension, especially in hot weather or if dehydrated. Continue current antihypertensive medications.   Prediabetes Assessment & Plan: Most recent hemoglobin A1c is 5.6 and improved.  She had been as high as 5.9 in the past.  She is currently on Wegovy 2.4 mg once a week for pharmacoprophylaxis.  She will continue medication and medically supervised weight management plan  Orders: -     ZOXWRU; Inject 2.4 mg into the skin once a week.  Dispense: 3 mL; Refill: 1  Abnormal food appetite Assessment & Plan: Improved significantly on GLP-1. She had increased orexigenic signaling, impaired satiety and inhibitory control. This is secondary to an abnormal energy regulation system and pathological neurohormonal pathways characteristic of excess adiposity.  In addition to nutritional and behavioral strategies she benefits from ongoing pharmacotherapy Wegovy.    Orders: -     Wegovy; Inject 2.4 mg into the skin once a week.  Dispense: 3 mL; Refill: 1  Class 2 severe obesity with serious comorbidity and body mass index (BMI) of 35.0 to 35.9 in adult, unspecified obesity type [facility]) Assessment & Plan: Weight Management   She has lost 43 pounds, with a recent loss of 1 pound over the last month. The slowed rate of weight loss is likely due to decreased physical activity and increased carbohydrate intake. She is aware of her carbohydrate sensitivity, which affects insulin levels and weight loss. Reginal Lutes has been effective in aiding weight loss. Emphasis was placed on maintaining a balanced diet with adequate protein and reduced simple carbohydrates. She is encouraged to increase physical activity to previous levels. Continue Wegovy at 2.4 mg once a week, reduce carbohydrate intake, especially simple sugars, and monitor weight and dietary intake to identify patterns affecting weight  loss.  Orders: -     Wegovy; Inject 2.4 mg into the skin once a week.  Dispense: 3 mL; Refill: 1               Objective   Physical Exam:  Blood pressure 107/75, pulse 82, temperature 98.1 F (36.7 C), height 5\' 8"  (1.727 m), weight 226 lb (102.5 kg), SpO2 99%. Body mass index is 34.36 kg/m.  General: She is overweight, cooperative, alert, well developed, and in no acute distress. PSYCH: Has normal mood, affect and thought process.   HEENT: EOMI, sclerae are anicteric. Lungs: Normal breathing effort, no conversational dyspnea. Extremities: No edema.  Neurologic: No gross sensory or motor deficits. No tremors or fasciculations noted.    Diagnostic Data Reviewed:  BMET    Component Value Date/Time   NA 142 10/01/2023 1645   K 4.3 10/01/2023 1645   CL 101 10/01/2023 1645   CO2 27 10/01/2023 1645   GLUCOSE 85 10/01/2023 1645   BUN 12 10/01/2023 1645   CREATININE 0.92 10/01/2023 1645   CALCIUM 9.8 10/01/2023 1645   GFRNONAA 82 11/10/2020 1045   GFRAA 94 11/10/2020 1045   Lab Results  Component Value Date   HGBA1C 5.6 07/25/2023   HGBA1C 5.8 04/18/2018   Lab Results  Component Value Date   INSULIN 22.4 12/26/2022   INSULIN 5.6 07/12/2020   Lab Results  Component Value Date  TSH 1.110 12/26/2022   CBC    Component Value Date/Time   WBC 8.3 12/10/2022 1654   RBC 5.02 12/10/2022 1654   HGB 14.3 12/10/2022 1654   HCT 42.2 12/10/2022 1654   PLT 279 12/10/2022 1654   MCV 84 12/10/2022 1654   MCH 28.5 12/10/2022 1654   MCHC 33.9 12/10/2022 1654   RDW 12.9 12/10/2022 1654   Iron Studies No results found for: "IRON", "TIBC", "FERRITIN", "IRONPCTSAT" Lipid Panel     Component Value Date/Time   CHOL 126 12/10/2022 1654   TRIG 97 12/10/2022 1654   HDL 55 12/10/2022 1654   CHOLHDL 2.3 12/10/2022 1654   LDLCALC 53 12/10/2022 1654   Hepatic Function Panel     Component Value Date/Time   PROT 8.0 12/10/2022 1654   ALBUMIN 4.7 12/10/2022 1654   AST  20 12/10/2022 1654   ALT 21 12/10/2022 1654   ALKPHOS 84 12/10/2022 1654   BILITOT 0.4 12/10/2022 1654      Component Value Date/Time   TSH 1.110 12/26/2022 0927   Nutritional Lab Results  Component Value Date   VD25OH 45.4 07/25/2023   VD25OH 44.1 12/10/2022   VD25OH 45.2 11/14/2021    Medications: Outpatient Encounter Medications as of 12/26/2023  Medication Sig   cetirizine (ZYRTEC) 10 MG tablet Take 1 tablet (10 mg total) by mouth daily.   EPINEPHrine 0.3 mg/0.3 mL IJ SOAJ injection Inject 0.3 mg into the muscle as needed for anaphylaxis. Use as directed   ergocalciferol (VITAMIN D2) 1.25 MG (50000 UT) capsule Take 1 capsule (50,000 Units total) by mouth once a week.   hydrochlorothiazide (HYDRODIURIL) 25 MG tablet Take 1 tablet (25 mg total) by mouth daily.   ibuprofen (ADVIL) 800 MG tablet TAKE 1 TABLET BY MOUTH 3 TIMES A DAY WITH FOOD AS NEEDED   telmisartan (MICARDIS) 40 MG tablet Take 1 tablet (40 mg total) by mouth daily.   [DISCONTINUED] Semaglutide-Weight Management (WEGOVY) 2.4 MG/0.75ML SOAJ Inject 2.4 mg into the skin once a week.   Semaglutide-Weight Management (WEGOVY) 2.4 MG/0.75ML SOAJ Inject 2.4 mg into the skin once a week.   [DISCONTINUED] Semaglutide-Weight Management (WEGOVY) 2.4 MG/0.75ML SOAJ Inject 2.4 mg into the skin once a week.   No facility-administered encounter medications on file as of 12/26/2023.     Follow-Up   Return in about 4 weeks (around 01/23/2024).Marland Kitchen She was informed of the importance of frequent follow up visits to maximize her success with intensive lifestyle modifications for her multiple health conditions.  Attestation Statement   Reviewed by clinician on day of visit: allergies, medications, problem list, medical history, surgical history, family history, social history, and previous encounter notes.     Worthy Rancher, MD

## 2023-12-26 NOTE — Assessment & Plan Note (Signed)
 Her blood pressure is well-controlled with hydrochlorothiazide and telmisartan, currently at 107/73, within the target range. She has not experienced symptoms of hypotension such as lightheadedness or dizziness. She is advised to monitor for symptoms of hypotension, especially in hot weather or if dehydrated. Continue current antihypertensive medications.

## 2023-12-26 NOTE — Progress Notes (Deleted)
 Office: [phone]  /  Fax: [phone]  Weight Summary And Biometrics  Vitals Temp: 98.1 F (36.7 C) BP: 107/75 Pulse Rate: 82 SpO2: 99 %   Anthropometric Measurements Height: 5\' 8"  (1.727 m) Weight: 226 lb (102.5 kg) BMI (Calculated): 34.37 Weight at Last Visit: 227 lb Weight Lost Since Last Visit: 1 lb Weight Gained Since Last Visit: 0 lb Starting Weight: 260 lb Total Weight Loss (lbs): 34 lb (15.4 kg) Peak Weight: 269 lb   Body Composition  Body Fat %: 40.8 % Fat Mass (lbs): 92.4 lbs Muscle Mass (lbs): 127.2 lbs Total Body Water (lbs): 84.4 lbs Visceral Fat Rating : 11    No data recorded Today's Visit #: 12  Starting Date: 12/17/22   Subjective   Chief Complaint: Obesity  Jane Armstrong is here to discuss her progress with her obesity treatment plan. She is on the {MWMwtlossportion/plan2:23431} and states she is following her eating plan approximately {empercentages:32441}% of the time. She states she is exercising {emminperweek:32114} {emperweek:32442} times per week.  Weight Progress Since Last Visit:  Since last office visit she has {emweight change:30888}. She reports {EMADHERENCE:28838::"good adherence to reduced calorie nutritional plan."} She has been working on [employer] labels","not skipping meals","increasing protein intake at every meal","drinking more water","making healthier choices","reducing portion sizes","incorporating more whole foods"}   Challenges affecting patient progress: {EMOBESITYBARRIERS:28841::"none"}.   Orexigenic Control: {Actions; denies-reports:32002} problems with appetite and hunger signals.  {Actions; denies-reports:32002} problems with satiety and satiation.  {Actions; denies-reports:32002} problems with eating patterns and portion control.  {Actions; denies-reports:32002} abnormal cravings. {Actions; denies-reports:32002} feeling deprived or restricted.   Pharmacotherapy for weight management:  She is currently taking {EMPharmaco:28845}.   Assessment and Plan   Treatment Plan For Obesity:  Recommended Dietary Goals  Jane Armstrong is currently in the action stage of change. As such, her goal is to continue weight management plan. She has agreed to: {EMWTLOSSPLAN:29297::"continue current plan"}  Behavioral Health and Counseling  We discussed the following behavioral modification strategies today: {EMWMwtlossstrategies:28914::"continue to work on maintaining a reduced calorie state, getting the recommended amount of protein, incorporating whole foods, making healthy choices, staying well hydrated and practicing mindfulness when eating."}.  Additional education and resources provided today: {EMadditionalresources:29169::"None"}  Recommended Physical Activity Goals  Jane Armstrong has been advised to work up to 150 minutes of moderate intensity aerobic activity a week and strengthening exercises 2-3 times per week for cardiovascular health, weight loss maintenance and preservation of muscle mass.   She has agreed to :  {EMEXERCISE:28847::"Think about enjoyable ways to increase daily physical activity and overcoming barriers to exercise","Increase physical activity in their day and reduce sedentary time (increase NEAT)."}  Pharmacotherapy  We discussed various medication options to help Jane Armstrong with her weight loss efforts and we both agreed to : {EMagreedrx:29170}  Associated Conditions Impacted by Obesity Treatment  Essential hypertension  Prediabetes  Abnormal food appetite  Class 2 severe obesity with serious comorbidity and body mass index (BMI) of 35.0 to 35.9 in adult, unspecified obesity type (HCC)     Objective   Physical Exam:  Blood pressure 107/75, pulse 82, temperature 98.1 F (36.7 C), height 5\' 8"  (1.727 m), weight 226 lb (102.5 kg), SpO2 99%. Body mass index is 34.36 kg/m.  General: She is overweight, cooperative, alert, well developed, and in no acute  distress. PSYCH: Has normal mood, affect and thought process.   HEENT: EOMI, sclerae are anicteric. Lungs: Normal breathing effort, no conversational dyspnea. Extremities: No edema.  Neurologic: No gross sensory or motor deficits. No tremors  or fasciculations noted.    Diagnostic Data Reviewed:  BMET    Component Value Date/Time   NA 142 10/01/2023 1645   K 4.3 10/01/2023 1645   CL 101 10/01/2023 1645   CO2 27 10/01/2023 1645   GLUCOSE 85 10/01/2023 1645   BUN 12 10/01/2023 1645   CREATININE 0.92 10/01/2023 1645   CALCIUM 9.8 10/01/2023 1645   GFRNONAA 82 11/10/2020 1045   GFRAA 94 11/10/2020 1045   Lab Results  Component Value Date   HGBA1C 5.6 07/25/2023   HGBA1C 5.8 04/18/2018   Lab Results  Component Value Date   INSULIN 22.4 12/26/2022   INSULIN 5.6 07/12/2020   Lab Results  Component Value Date   TSH 1.110 12/26/2022   CBC    Component Value Date/Time   WBC 8.3 12/10/2022 1654   RBC 5.02 12/10/2022 1654   HGB 14.3 12/10/2022 1654   HCT 42.2 12/10/2022 1654   PLT 279 12/10/2022 1654   MCV 84 12/10/2022 1654   MCH 28.5 12/10/2022 1654   MCHC 33.9 12/10/2022 1654   RDW 12.9 12/10/2022 1654   Iron Studies No results found for: "IRON", "TIBC", "FERRITIN", "IRONPCTSAT" Lipid Panel     Component Value Date/Time   CHOL 126 12/10/2022 1654   TRIG 97 12/10/2022 1654   HDL 55 12/10/2022 1654   CHOLHDL 2.3 12/10/2022 1654   LDLCALC 53 12/10/2022 1654   Hepatic Function Panel     Component Value Date/Time   PROT 8.0 12/10/2022 1654   ALBUMIN 4.7 12/10/2022 1654   AST 20 12/10/2022 1654   ALT 21 12/10/2022 1654   ALKPHOS 84 12/10/2022 1654   BILITOT 0.4 12/10/2022 1654      Component Value Date/Time   TSH 1.110 12/26/2022 0927   Nutritional Lab Results  Component Value Date   VD25OH 45.4 07/25/2023   VD25OH 44.1 12/10/2022   VD25OH 45.2 11/14/2021    Medications: Outpatient Encounter Medications as of 12/26/2023  Medication Sig   cetirizine  (ZYRTEC) 10 MG tablet Take 1 tablet (10 mg total) by mouth daily.   EPINEPHrine 0.3 mg/0.3 mL IJ SOAJ injection Inject 0.3 mg into the muscle as needed for anaphylaxis. Use as directed   ergocalciferol (VITAMIN D2) 1.25 MG (50000 UT) capsule Take 1 capsule (50,000 Units total) by mouth once a week.   hydrochlorothiazide (HYDRODIURIL) 25 MG tablet Take 1 tablet (25 mg total) by mouth daily.   ibuprofen (ADVIL) 800 MG tablet TAKE 1 TABLET BY MOUTH 3 TIMES A DAY WITH FOOD AS NEEDED   Semaglutide-Weight Management (WEGOVY) 2.4 MG/0.75ML SOAJ Inject 2.4 mg into the skin once a week.   telmisartan (MICARDIS) 40 MG tablet Take 1 tablet (40 mg total) by mouth daily.   No facility-administered encounter medications on file as of 12/26/2023.     Follow-Up   No follow-ups on file.Marland Kitchen She was informed of the importance of frequent follow up visits to maximize her success with intensive lifestyle modifications for her multiple health conditions.  Attestation Statement   Reviewed by clinician on day of visit: allergies, medications, problem list, medical history, surgical history, family history, social history, and previous encounter notes.     Worthy Rancher, MD

## 2023-12-26 NOTE — Assessment & Plan Note (Signed)
 Weight Management   She has lost 43 pounds, with a recent loss of 1 pound over the last month. The slowed rate of weight loss is likely due to decreased physical activity and increased carbohydrate intake. She is aware of her carbohydrate sensitivity, which affects insulin levels and weight loss. Reginal Lutes has been effective in aiding weight loss. Emphasis was placed on maintaining a balanced diet with adequate protein and reduced simple carbohydrates. She is encouraged to increase physical activity to previous levels. Continue Wegovy at 2.4 mg once a week, reduce carbohydrate intake, especially simple sugars, and monitor weight and dietary intake to identify patterns affecting weight loss.

## 2023-12-27 LAB — HM PAP SMEAR: HPV, high-risk: NEGATIVE

## 2023-12-30 ENCOUNTER — Other Ambulatory Visit: Payer: Self-pay | Admitting: Obstetrics and Gynecology

## 2023-12-30 DIAGNOSIS — R928 Other abnormal and inconclusive findings on diagnostic imaging of breast: Secondary | ICD-10-CM

## 2024-01-06 ENCOUNTER — Other Ambulatory Visit (HOSPITAL_COMMUNITY): Payer: Self-pay

## 2024-01-10 ENCOUNTER — Encounter (INDEPENDENT_AMBULATORY_CARE_PROVIDER_SITE_OTHER): Payer: Self-pay | Admitting: Family Medicine

## 2024-01-13 ENCOUNTER — Other Ambulatory Visit (HOSPITAL_COMMUNITY): Payer: Self-pay

## 2024-01-13 NOTE — Telephone Encounter (Signed)
 Patient Jane Armstrong (Key: [id number]) Wegovy  2.4MG /0.75ML auto-injectors Pt PA has been sent and Pending

## 2024-01-21 ENCOUNTER — Telehealth (INDEPENDENT_AMBULATORY_CARE_PROVIDER_SITE_OTHER): Payer: Self-pay

## 2024-01-21 NOTE — Telephone Encounter (Signed)
 status: Appeal Request - Denied for Wegovy  2.4

## 2024-01-29 ENCOUNTER — Telehealth: Payer: Self-pay

## 2024-01-29 NOTE — Telephone Encounter (Signed)
 Called patient to see if she could come 02/05/2024 for the mobile mammogram bus.

## 2024-02-04 NOTE — Progress Notes (Signed)
 [place], CMA,acting as a Neurosurgeon for Susanna Epley, FNP.,have documented all relevant documentation on the behalf of Susanna Epley, FNP,as directed by  Susanna Epley, FNP while in the presence of Susanna Epley, FNP.  Subjective:    Patient ID: Jane Armstrong , female    DOB: 05-02-1971 , 53 y.o.   MRN: [id number]  Chief Complaint  Patient presents with   Annual Exam    Patient presents today for HM, Patient reports compliance with medication. Patient denies any chest pain, SOB, or headaches. Patient has no concerns today.     HPI  Patient presents for HM, no concerns, taking medications as prescribed.  She sees her OB/GYN for pap and mammogram, to follow up for breast imaging. No headaches, dizziness, chest pain, shortness of breath or palpitations.  BP controlled, does not check at home, no signs of elevated or low blood pressure. Seasonal allergies controlled.    Sees healthy weight and wellness.  Has made changes to her diet in increasing protein, has cravings at times craves sweets.  Increasing exercise, 3 days a week for 60-90 minutes.  Water intake has increased but she is still working on this with flavored water.   Sleeps 6-8 hours and feels rested.       Past Medical History:  Diagnosis Date   COVID-19 vaccine administered 10/10/2023   Hypertension    Iron deficiency anemia 12/10/2022   Obesity      Family History  Problem Relation Age of Onset   Hypertension Mother    Diabetes Mother    Breast cancer Mother 31   Heart disease Mother    Kidney disease Mother    Sleep apnea Mother    Obesity Mother      Current Outpatient Medications:    cetirizine  (ZYRTEC ) 10 MG tablet, Take 1 tablet (10 mg total) by mouth daily., Disp: 90 tablet, Rfl: 1   EPINEPHrine  0.3 mg/0.3 mL IJ SOAJ injection, Inject 0.3 mg into the muscle as needed for anaphylaxis. Use as directed, Disp: 1 each, Rfl: 3   ergocalciferol  (VITAMIN D2) 1.25 MG (50000 UT) capsule, Take 1 capsule (50,000  Units total) by mouth once a week., Disp: 4 capsule, Rfl: 5   hydrochlorothiazide  (HYDRODIURIL ) 25 MG tablet, Take 1 tablet (25 mg total) by mouth daily., Disp: 90 tablet, Rfl: 1   ibuprofen  (ADVIL ) 800 MG tablet, TAKE 1 TABLET BY MOUTH 3 TIMES A DAY WITH FOOD AS NEEDED, Disp: 90 tablet, Rfl: 0   Semaglutide -Weight Management (WEGOVY ) 2.4 MG/0.75ML SOAJ, Inject 2.4 mg into the skin once a week., Disp: 3 mL, Rfl: 1   telmisartan  (MICARDIS ) 40 MG tablet, Take 1 tablet (40 mg total) by mouth daily., Disp: 90 tablet, Rfl: 1   Allergies  Allergen Reactions   Bee Venom Shortness Of Breath   Clindamycin/Lincomycin Hives   Latex Itching and Rash      The patient states she uses none for birth control. Patient's last menstrual period was 02/01/2024.  Negative for Dysmenorrhea and Negative for Menorrhagia. Negative for: breast discharge, breast lump(s), breast pain and breast self exam. Associated symptoms include abnormal vaginal bleeding. Pertinent negatives include abnormal bleeding (hematology), anxiety, decreased libido, depression, difficulty falling sleep, dyspareunia, history of infertility, nocturia, sexual dysfunction, sleep disturbances, urinary incontinence, urinary urgency, vaginal discharge and vaginal itching.   The patient's tobacco use is:  Social History   Tobacco Use  Smoking Status Never  Smokeless Tobacco Never  . She has been exposed to passive smoke. The  patient's alcohol use is:  Social History   Substance and Sexual Activity  Alcohol Use Yes   Comment: socially   Additional information: Last pap 12/11/2022, next one scheduled for 12/10/2025.    Review of Systems  Constitutional:  Negative for chills, fatigue and fever.  HENT:  Negative for congestion.   Respiratory:  Negative for chest tightness, shortness of breath and wheezing.   Cardiovascular:  Negative for chest pain, palpitations and leg swelling.  Gastrointestinal:  Negative for constipation, diarrhea and  nausea.  Endocrine: Negative for polydipsia, polyphagia and polyuria.  Genitourinary:  Negative for dysuria.  Neurological:  Negative for dizziness, weakness and headaches.  All other systems reviewed and are negative.    Today's Vitals   02/05/24 0824  BP: 120/70  Pulse: 81  Temp: 98.5 F (36.9 C)  TempSrc: Oral  Weight: 232 lb 3.2 oz (105.3 kg)  Height: 5\' 8"  (1.727 m)  PainSc: 0-No pain   Body mass index is 35.31 kg/m.  Wt Readings from Last 3 Encounters:  02/05/24 232 lb 3.2 oz (105.3 kg)  12/26/23 226 lb (102.5 kg)  11/25/23 227 lb (103 kg)     Objective:  Physical Exam Constitutional:      Appearance: Normal appearance.  HENT:     Head: Normocephalic and atraumatic.  Cardiovascular:     Rate and Rhythm: Normal rate and regular rhythm.     Pulses: Normal pulses.     Heart sounds: Normal heart sounds.  Pulmonary:     Effort: Pulmonary effort is normal.     Breath sounds: Normal breath sounds.  Chest:  Breasts:    Right: Skin change (scaring from breast reduction surgery) present.     Left: Skin change (Scaring from breast reduction surgery) present.  Abdominal:     General: Abdomen is flat.     Palpations: Abdomen is soft.  Musculoskeletal:     Cervical back: Normal range of motion.  Skin:    General: Skin is warm and dry.  Neurological:     General: No focal deficit present.     Mental Status: She is alert and oriented to person, place, and time.  Psychiatric:        Mood and Affect: Mood normal.        Behavior: Behavior normal.      Assessment And Plan:     Encounter for annual health examination Assessment & Plan: Behavior modifications discussed and diet history reviewed.   Pt will continue to exercise regularly and modify diet with low GI, plant based foods and decrease intake of processed foods.  Recommend intake of daily multivitamin, Vitamin D , and calcium.  Recommend mammogram (repeat mammogram with ultrasound scheduled for tomorrow) and  colonoscopy for preventive screenings, as well as recommend immunizations that include influenza, TDAP, and Shingles    Essential hypertension Assessment & Plan: Continue medications as prescribed.  Continue low salt diet, increase physical activity. EKG done NSR HR 81  Orders: -     EKG 12-Lead -     POCT URINALYSIS DIP (CLINITEK) -     Microalbumin / creatinine urine ratio -     CMP14+EGFR -     Lipid panel  Prediabetes Assessment & Plan: Stable, diet controlled. Continue focusing on healthy diet and regular exercise.   Orders: -     Hemoglobin A1c  Vitamin D  deficiency Assessment & Plan: Will check vitamin D  level and supplement as needed, refill sent   Also encouraged to spend 15 minutes  in the sun daily.   Orders: -     VITAMIN D  25 Hydroxy (Vit-D Deficiency, Fractures)  Class 2 severe obesity due to excess calories with serious comorbidity and body mass index (BMI) of 35.0 to 35.9 in adult [facility]) Assessment & Plan: Continue with healthy weight and wellness, increase protein intake, eat a variety of fruits and vegetables. Goal to exercise 150 minutes per week with at least 2 days of strength training Increase water intake to at least one gallon of water daily.    Other long term (current) drug therapy -     CBC with Differential/Platelet  Encounter for screening for metabolic disorder -     Lipid panel     Return for 1 year physical, 6 month bp check. Patient was given opportunity to ask questions. Patient verbalized understanding of the plan and was able to repeat key elements of the plan. All questions were answered to their satisfaction.   Susanna Epley, FNP  I, Susanna Epley, FNP, have reviewed all documentation for this visit. The documentation on 02/05/24 for the exam, diagnosis, procedures, and orders are all accurate and complete.

## 2024-02-05 ENCOUNTER — Encounter: Payer: Self-pay | Admitting: Nurse Practitioner

## 2024-02-05 ENCOUNTER — Ambulatory Visit (INDEPENDENT_AMBULATORY_CARE_PROVIDER_SITE_OTHER): Admitting: Nurse Practitioner

## 2024-02-05 VITALS — BP 120/70 | HR 81 | Temp 98.5°F | Ht 68.0 in | Wt 232.2 lb

## 2024-02-05 DIAGNOSIS — Z0000 Encounter for general adult medical examination without abnormal findings: Secondary | ICD-10-CM

## 2024-02-05 DIAGNOSIS — R7303 Prediabetes: Secondary | ICD-10-CM

## 2024-02-05 DIAGNOSIS — I10 Essential (primary) hypertension: Secondary | ICD-10-CM | POA: Diagnosis not present

## 2024-02-05 DIAGNOSIS — E66812 Obesity, class 2: Secondary | ICD-10-CM

## 2024-02-05 DIAGNOSIS — Z79899 Other long term (current) drug therapy: Secondary | ICD-10-CM

## 2024-02-05 DIAGNOSIS — Z13228 Encounter for screening for other metabolic disorders: Secondary | ICD-10-CM

## 2024-02-05 DIAGNOSIS — E559 Vitamin D deficiency, unspecified: Secondary | ICD-10-CM

## 2024-02-05 DIAGNOSIS — Z6835 Body mass index (BMI) 35.0-35.9, adult: Secondary | ICD-10-CM

## 2024-02-05 LAB — POCT URINALYSIS DIP (CLINITEK)
Bilirubin, UA: NEGATIVE
Glucose, UA: NEGATIVE mg/dL
Ketones, POC UA: NEGATIVE mg/dL
Leukocytes, UA: NEGATIVE
Nitrite, UA: NEGATIVE
POC PROTEIN,UA: NEGATIVE
Spec Grav, UA: 1.025 (ref 1.010–1.025)
Urobilinogen, UA: 0.2 U/dL
pH, UA: 6.5 (ref 5.0–8.0)

## 2024-02-05 NOTE — Assessment & Plan Note (Signed)
 Behavior modifications discussed and diet history reviewed.   Pt will continue to exercise regularly and modify diet with low GI, plant based foods and decrease intake of processed foods.  Recommend intake of daily multivitamin, Vitamin D , and calcium.  Recommend mammogram (repeat mammogram with ultrasound scheduled for tomorrow) and colonoscopy for preventive screenings, as well as recommend immunizations that include influenza, TDAP, and Shingles

## 2024-02-05 NOTE — Assessment & Plan Note (Signed)
 Continue with healthy weight and wellness, increase protein intake, eat a variety of fruits and vegetables. Goal to exercise 150 minutes per week with at least 2 days of strength training Increase water intake to at least one gallon of water daily.

## 2024-02-05 NOTE — Assessment & Plan Note (Signed)
 Will check vitamin D  level and supplement as needed, refill sent   Also encouraged to spend 15 minutes in the sun daily.

## 2024-02-05 NOTE — Assessment & Plan Note (Addendum)
 Continue medications as prescribed.  Continue low salt diet, increase physical activity. EKG done NSR HR 81

## 2024-02-05 NOTE — Assessment & Plan Note (Deleted)
 Continue with wegovy  at healthy weight and wellness

## 2024-02-05 NOTE — Patient Instructions (Signed)
 Health Maintenance  Topic Date Due   Mammogram  01/20/2024   COVID-19 Vaccine (5 - Pfizer risk 2024-25 season) 03/30/2024   Flu Shot  04/17/2024   Pap with HPV screening  12/10/2025   DTaP/Tdap/Td vaccine (2 - Td or Tdap) 07/16/2028   Colon Cancer Screening  02/25/2031   Hepatitis C Screening  Completed   HIV Screening  Completed   Zoster (Shingles) Vaccine  Completed   HPV Vaccine  Aged Out   Meningitis B Vaccine  Aged Out

## 2024-02-05 NOTE — Assessment & Plan Note (Signed)
 Stable, diet controlled. Continue focusing on healthy diet and regular exercise.

## 2024-02-06 ENCOUNTER — Ambulatory Visit
Admission: RE | Admit: 2024-02-06 | Discharge: 2024-02-06 | Disposition: A | Discharge status: Home / Self Care | Payer: Self-pay | Source: Ambulatory Visit | Attending: Obstetrics and Gynecology | Admitting: Obstetrics and Gynecology

## 2024-02-06 DIAGNOSIS — R928 Other abnormal and inconclusive findings on diagnostic imaging of breast: Secondary | ICD-10-CM

## 2024-02-06 DIAGNOSIS — R922 Inconclusive mammogram: Secondary | ICD-10-CM | POA: Diagnosis not present

## 2024-02-06 DIAGNOSIS — N6313 Unspecified lump in the right breast, lower outer quadrant: Secondary | ICD-10-CM | POA: Diagnosis not present

## 2024-02-06 LAB — CMP14+EGFR
ALT: 20 IU/L (ref 0–32)
AST: 18 IU/L (ref 0–40)
Albumin: 4.5 g/dL (ref 3.8–4.9)
Alkaline Phosphatase: 88 IU/L (ref 44–121)
BUN/Creatinine Ratio: 15 (ref 9–23)
BUN: 12 mg/dL (ref 6–24)
Bilirubin Total: 0.4 mg/dL (ref 0.0–1.2)
CO2: 24 mmol/L (ref 20–29)
Calcium: 9.5 mg/dL (ref 8.7–10.2)
Chloride: 99 mmol/L (ref 96–106)
Creatinine, Ser: 0.8 mg/dL (ref 0.57–1.00)
Globulin, Total: 3.1 g/dL (ref 1.5–4.5)
Glucose: 71 mg/dL (ref 70–99)
Potassium: 3.3 mmol/L — ABNORMAL LOW (ref 3.5–5.2)
Sodium: 140 mmol/L (ref 134–144)
Total Protein: 7.6 g/dL (ref 6.0–8.5)
eGFR: 89 mL/min/{1.73_m2} (ref >59)

## 2024-02-06 LAB — CBC WITH DIFFERENTIAL/PLATELET
Basophils Absolute: 0 10*3/uL (ref 0.0–0.2)
Basos: 1 %
EOS (ABSOLUTE): 0.1 10*3/uL (ref 0.0–0.4)
Eos: 1 %
Hematocrit: 43.7 % (ref 34.0–46.6)
Hemoglobin: 14.2 g/dL (ref 11.1–15.9)
Immature Grans (Abs): 0 10*3/uL (ref 0.0–0.1)
Immature Granulocytes: 0 %
Lymphocytes Absolute: 1.9 10*3/uL (ref 0.7–3.1)
Lymphs: 33 %
MCH: 27.8 pg (ref 26.6–33.0)
MCHC: 32.5 g/dL (ref 31.5–35.7)
MCV: 86 fL (ref 79–97)
Monocytes Absolute: 0.3 10*3/uL (ref 0.1–0.9)
Monocytes: 4 %
Neutrophils Absolute: 3.6 10*3/uL (ref 1.4–7.0)
Neutrophils: 61 %
Platelets: 288 10*3/uL (ref 150–450)
RBC: 5.1 x10E6/uL (ref 3.77–5.28)
RDW: 13 % (ref 11.7–15.4)
WBC: 5.8 10*3/uL (ref 3.4–10.8)

## 2024-02-06 LAB — LIPID PANEL
Chol/HDL Ratio: 2.3 ratio (ref 0.0–4.4)
Cholesterol, Total: 128 mg/dL (ref 100–199)
HDL: 56 mg/dL (ref >39)
LDL Chol Calc (NIH): 60 mg/dL (ref 0–99)
Triglycerides: 56 mg/dL (ref 0–149)
VLDL Cholesterol Cal: 12 mg/dL (ref 5–40)

## 2024-02-06 LAB — HEMOGLOBIN A1C
Est. average glucose Bld gHb Est-mCnc: 105 mg/dL
Hgb A1c MFr Bld: 5.3 % (ref 4.8–5.6)

## 2024-02-06 LAB — MICROALBUMIN / CREATININE URINE RATIO
Creatinine, Urine: 107.3 mg/dL
Microalb/Creat Ratio: 35 mg/g{creat} — ABNORMAL HIGH (ref 0–29)
Microalbumin, Urine: 37.6 ug/mL

## 2024-02-06 LAB — VITAMIN D 25 HYDROXY (VIT D DEFICIENCY, FRACTURES): Vit D, 25-Hydroxy: 40.4 ng/mL (ref 30.0–100.0)

## 2024-02-13 ENCOUNTER — Ambulatory Visit (INDEPENDENT_AMBULATORY_CARE_PROVIDER_SITE_OTHER): Admitting: Internal Medicine

## 2024-02-13 ENCOUNTER — Encounter (INDEPENDENT_AMBULATORY_CARE_PROVIDER_SITE_OTHER): Payer: Self-pay | Admitting: Internal Medicine

## 2024-02-13 ENCOUNTER — Other Ambulatory Visit (HOSPITAL_COMMUNITY): Payer: Self-pay

## 2024-02-13 VITALS — BP 104/74 | HR 76 | Temp 98.7°F | Ht 68.0 in | Wt 223.0 lb

## 2024-02-13 DIAGNOSIS — R7303 Prediabetes: Secondary | ICD-10-CM

## 2024-02-13 DIAGNOSIS — I10 Essential (primary) hypertension: Secondary | ICD-10-CM

## 2024-02-13 DIAGNOSIS — R638 Other symptoms and signs concerning food and fluid intake: Secondary | ICD-10-CM | POA: Diagnosis not present

## 2024-02-13 DIAGNOSIS — E66812 Obesity, class 2: Secondary | ICD-10-CM | POA: Diagnosis not present

## 2024-02-13 DIAGNOSIS — Z6835 Body mass index (BMI) 35.0-35.9, adult: Secondary | ICD-10-CM

## 2024-02-13 MED ORDER — PHENTERMINE-TOPIRAMATE ER 7.5-46 MG PO CP24
1.0000 | ORAL_CAPSULE | Freq: Every day | ORAL | 0 refills | Status: DC
Start: 2024-02-13 — End: 2024-03-24
  Filled 2024-02-13 – 2024-03-03 (×4): qty 30, 30d supply, fill #0

## 2024-02-13 MED ORDER — QSYMIA 3.75-23 MG PO CP24
1.0000 | ORAL_CAPSULE | Freq: Every day | ORAL | 0 refills | Status: DC
Start: 2024-02-13 — End: 2024-03-24
  Filled 2024-02-13 – 2024-02-17 (×5): qty 14, 14d supply, fill #0

## 2024-02-13 MED ORDER — TELMISARTAN 20 MG PO TABS
20.0000 mg | ORAL_TABLET | Freq: Every day | ORAL | 0 refills | Status: DC
  Filled 2024-02-13: qty 30, 30d supply, fill #0

## 2024-02-13 MED ORDER — TELMISARTAN 40 MG PO TABS: 40.0000 mg | ORAL_TABLET | Freq: Every day | ORAL | Status: DC

## 2024-02-13 NOTE — Progress Notes (Signed)
 Office: [phone]  /  Fax: [phone]  Weight Summary And Biometrics  Vitals Temp: 98.7 F (37.1 C) BP: 104/74 Pulse Rate: 76 SpO2: 100 %   Anthropometric Measurements Height: 5\' 8"  (1.727 m) Weight: 223 lb (101.2 kg) BMI (Calculated): 33.91 Weight at Last Visit: 226lb Weight Lost Since Last Visit: 3lb Weight Gained Since Last Visit: 0lb Starting Weight: 260lb Total Weight Loss (lbs): 37 lb (16.8 kg) Peak Weight: 296lb   Body Composition  Body Fat %: 40.5 % Fat Mass (lbs): 90.6 lbs Muscle Mass (lbs): 126.2 lbs Total Body Water (lbs): 84.4 lbs Visceral Fat Rating : 11    No data recorded Today's Visit #: 13  Starting Date: 12/17/22   Subjective   Chief Complaint: Obesity  Interval History Discussed the use of AI scribe software for clinical note transcription with the patient, who gave verbal consent to proceed.  History of Present Illness   Jane Armstrong is a 53 year old female who presents for medical weight management.  She has lost three pounds since her last office visit. She adheres to a 1500 calorie nutrition plan 70-80% of the time, tracks her caloric intake, consumes more whole foods, ensures adequate protein intake, and maintains proper hydration. She does not skip meals.  She engages in physical activity three days a week for approximately 60 minutes, focusing on strengthening exercises. She reports adequate sleep and no significant stress levels.  Her current medications include hydrochlorothiazide  and telmisartan  40 mg. She denies experiencing lightheadedness or dizziness. She was informed that her insurance will no longer cover Wegovy , which she has been using for weight management. Her last injection was on Feb 04, 2024, and she finds the cost without insurance coverage prohibitive.       Challenges affecting patient progress: having difficulties with GLP-1 or AOM coverage.    Pharmacotherapy for weight management: She is  currently taking Wegovy  with adequate clinical response , without side effects., and but insurance is no longer covering.   Assessment and Plan   Treatment Plan For Obesity:  Recommended Dietary Goals  Gracin is currently in the action stage of change. As such, her goal is to continue weight management plan. She has agreed to: continue current plan  Behavioral Health and Counseling  We discussed the following behavioral modification strategies today: continue to work on maintaining a reduced calorie state, getting the recommended amount of protein, incorporating whole foods, making healthy choices, staying well hydrated and practicing mindfulness when eating..  I also want her to increase consumption of fibrous food to increase sense of fullness as she is coming off GLP-1  Additional education and resources provided today: None  Recommended Physical Activity Goals  Wanna has been advised to work up to 150 minutes of moderate intensity aerobic activity a week and strengthening exercises 2-3 times per week for cardiovascular health, weight loss maintenance and preservation of muscle mass.   She has agreed to :  Think about enjoyable ways to increase daily physical activity and overcoming barriers to exercise and Increase physical activity in their day and reduce sedentary time (increase NEAT).  Pharmacotherapy  We discussed various medication options to help Lovell with her weight loss efforts and we both agreed to : start anti-obesity medication.  In addition to reduced calorie nutrition plan (RCNP), behavioral strategies and physical activity, Hector would benefit from pharmacotherapy to assist with hunger signals, satiety and cravings. This will reduce obesity-related health risks by inducing weight loss, and help reduce food consumption and  adherence to [facility]) . It may also improve QOL by improving self-confidence and reduce the  setbacks associated with metabolic adaptations.  She  had been on Wegovy  and had lost 37 pounds but her insurance is no longer covering medication  After discussion of treatment options, mechanisms of action, benefits, side effects, contraindications and shared decision making she is agreeable to starting Qsymia. Patient also made aware that medication is indicated for long-term management of obesity and the risk of weight regain following discontinuation of treatment and hence the importance of adhering to medical weight loss plan.    1.  We reviewed and signed controlled substance agreement today 2.  We reviewed state registry for controlled medications  Associated Conditions Impacted by Obesity Treatment  Essential hypertension -     Telmisartan ; Take 1 tablet (40 mg total) by mouth daily.  Class 2 severe obesity with serious comorbidity and body mass index (BMI) of 35.0 to 35.9 in adult, unspecified obesity type (HCC) -     Qsymia; Take 1 capsule by mouth daily.  Dispense: 14 capsule; Refill: 0 -     Phentermine-Topiramate; Take 1 capsule by mouth daily.  Dispense: 30 capsule; Refill: 0  Abnormal food appetite -     Qsymia; Take 1 capsule by mouth daily.  Dispense: 14 capsule; Refill: 0 -     Phentermine-Topiramate; Take 1 capsule by mouth daily.  Dispense: 30 capsule; Refill: 0  Prediabetes     Assessment and Plan    Obesity /abnormal food appetite Obesity management with previous success on Wegovy , resulting in a 37-pound weight loss. Insurance no longer covers Wegovy , necessitating a transition to Qsymia, a combination of phentermine and topiramate, due to its appetite suppressant properties. Discussed potential increased hunger as Wegovy  wears off and the benefits and side effects of Qsymia, including the potential for increased blood pressure. Signed a controlled substance agreement and reviewed the state database. - Transition from Wegovy  to Qsymia. Prescribe starter dose of Qsymia (3.75/23 mg) for two weeks, then increase to 7.5  mg. - Instruct to download and use a savings card for Qsymia to reduce out-of-pocket costs. - Advise to contact insurance to verify if Zepbound  is covered as an alternative to Wegovy . - Schedule follow-up in four weeks to assess progress and medication efficacy.  Hypertension Hypertension managed with hydrochlorothiazide  and telmisartan . Blood pressure is low normal at 104/74 mmHg. Considered reducing telmisartan  dose due to low blood pressure, but decided against it at this time as she will be starting a sympathomimetic. Will monitor for orthostatic dizziness or further blood pressure drops. - Maintain current telmisartan  dose at 40 mg due to potential blood pressure increase from Qsymia. - Monitor blood pressure regularly, especially during hot weather or physical activity. - Instruct to report any symptoms of orthostatic dizziness.  Prediabetes Prediabetes with consideration for starting metformin for diabetes prevention. However, given the need for a strong appetite suppressant during the transition off Wegovy , metformin initiation is deferred.         Objective   Physical Exam:  Blood pressure 104/74, pulse 76, temperature 98.7 F (37.1 C), height 5\' 8"  (1.727 m), weight 223 lb (101.2 kg), last menstrual period 02/01/2024, SpO2 100%. Body mass index is 33.91 kg/m.  General: She is overweight, cooperative, alert, well developed, and in no acute distress. PSYCH: Has normal mood, affect and thought process.   HEENT: EOMI, sclerae are anicteric. Lungs: Normal breathing effort, no conversational dyspnea. Extremities: No edema.  Neurologic: No gross sensory or motor  deficits. No tremors or fasciculations noted.    Diagnostic Data Reviewed:  BMET    Component Value Date/Time   NA 140 02/05/2024 0920   K 3.3 (L) 02/05/2024 0920   CL 99 02/05/2024 0920   CO2 24 02/05/2024 0920   GLUCOSE 71 02/05/2024 0920   BUN 12 02/05/2024 0920   CREATININE 0.80 02/05/2024 0920   CALCIUM  9.5 02/05/2024 0920   GFRNONAA 82 11/10/2020 1045   GFRAA 94 11/10/2020 1045   Lab Results  Component Value Date   HGBA1C 5.3 02/05/2024   HGBA1C 5.8 04/18/2018   Lab Results  Component Value Date   INSULIN  22.4 12/26/2022   INSULIN  5.6 07/12/2020   Lab Results  Component Value Date   TSH 1.110 12/26/2022   CBC    Component Value Date/Time   WBC 5.8 02/05/2024 0920   RBC 5.10 02/05/2024 0920   HGB 14.2 02/05/2024 0920   HCT 43.7 02/05/2024 0920   PLT 288 02/05/2024 0920   MCV 86 02/05/2024 0920   MCH 27.8 02/05/2024 0920   MCHC 32.5 02/05/2024 0920   RDW 13.0 02/05/2024 0920   Iron Studies No results found for: "IRON", "TIBC", "FERRITIN", "IRONPCTSAT" Lipid Panel     Component Value Date/Time   CHOL 128 02/05/2024 0920   TRIG 56 02/05/2024 0920   HDL 56 02/05/2024 0920   CHOLHDL 2.3 02/05/2024 0920   LDLCALC 60 02/05/2024 0920   Hepatic Function Panel     Component Value Date/Time   PROT 7.6 02/05/2024 0920   ALBUMIN 4.5 02/05/2024 0920   AST 18 02/05/2024 0920   ALT 20 02/05/2024 0920   ALKPHOS 88 02/05/2024 0920   BILITOT 0.4 02/05/2024 0920      Component Value Date/Time   TSH 1.110 12/26/2022 0927   Nutritional Lab Results  Component Value Date   VD25OH 40.4 02/05/2024   VD25OH 45.4 07/25/2023   VD25OH 44.1 12/10/2022    Medications: Outpatient Encounter Medications as of 02/13/2024  Medication Sig   cetirizine  (ZYRTEC ) 10 MG tablet Take 1 tablet (10 mg total) by mouth daily.   EPINEPHrine  0.3 mg/0.3 mL IJ SOAJ injection Inject 0.3 mg into the muscle as needed for anaphylaxis. Use as directed   ergocalciferol  (VITAMIN D2) 1.25 MG (50000 UT) capsule Take 1 capsule (50,000 Units total) by mouth once a week.   hydrochlorothiazide  (HYDRODIURIL ) 25 MG tablet Take 1 tablet (25 mg total) by mouth daily.   ibuprofen  (ADVIL ) 800 MG tablet TAKE 1 TABLET BY MOUTH 3 TIMES A DAY WITH FOOD AS NEEDED   Phentermine-Topiramate (QSYMIA) 3.75-23 MG CP24 Take 1  capsule by mouth daily.   Phentermine-Topiramate 7.5-46 MG CP24 Take 1 capsule by mouth daily.   [DISCONTINUED] Semaglutide -Weight Management (WEGOVY ) 2.4 MG/0.75ML SOAJ Inject 2.4 mg into the skin once a week.   [DISCONTINUED] telmisartan  (MICARDIS ) 20 MG tablet Take 1 tablet (20 mg total) by mouth daily.   [DISCONTINUED] telmisartan  (MICARDIS ) 40 MG tablet Take 1 tablet (40 mg total) by mouth daily.   telmisartan  (MICARDIS ) 40 MG tablet Take 1 tablet (40 mg total) by mouth daily.   No facility-administered encounter medications on file as of 02/13/2024.     Follow-Up   Return in about 4 weeks (around 03/12/2024) for For Weight Mangement with Dr. Allie Area.Aaron Aas She was informed of the importance of frequent follow up visits to maximize her success with intensive lifestyle modifications for her multiple health conditions.  Attestation Statement   Reviewed by clinician on day of  visit: allergies, medications, problem list, medical history, surgical history, family history, social history, and previous encounter notes.     Ladd Picker, MD

## 2024-02-14 ENCOUNTER — Other Ambulatory Visit (HOSPITAL_COMMUNITY): Payer: Self-pay

## 2024-02-17 ENCOUNTER — Other Ambulatory Visit (HOSPITAL_COMMUNITY): Payer: Self-pay

## 2024-02-17 ENCOUNTER — Encounter (INDEPENDENT_AMBULATORY_CARE_PROVIDER_SITE_OTHER): Payer: Self-pay | Admitting: Family Medicine

## 2024-02-17 ENCOUNTER — Other Ambulatory Visit: Payer: Self-pay

## 2024-02-17 ENCOUNTER — Telehealth (INDEPENDENT_AMBULATORY_CARE_PROVIDER_SITE_OTHER): Payer: Self-pay

## 2024-02-17 NOTE — Telephone Encounter (Signed)
 Left message for patient to return my call so I can get more information about her previous message. It was unclear what she was asking for.

## 2024-02-18 ENCOUNTER — Telehealth (INDEPENDENT_AMBULATORY_CARE_PROVIDER_SITE_OTHER): Payer: Self-pay

## 2024-02-18 NOTE — Telephone Encounter (Signed)
 PA for Qsymia  response.  Your request has been n/a This case has been cancelled by the provider because the prescriber will initiate a new request for Generic equivalent.

## 2024-02-25 ENCOUNTER — Other Ambulatory Visit (HOSPITAL_COMMUNITY): Payer: Self-pay

## 2024-02-25 ENCOUNTER — Telehealth: Payer: Self-pay | Admitting: *Deleted

## 2024-02-25 NOTE — Telephone Encounter (Signed)
 Prior authorization done via cover my meds for patients Qsymia. Waiting on determination.

## 2024-02-25 NOTE — Telephone Encounter (Signed)
 Prior authorization denied for patients Qsymia . Its a plan exclusion.  Denied today by [employer] 2017 NCPDP Request Reference Number: [id number]. QSYMIA  CAP 7.5-46MG  is denied due to Plan Exclusion. For further questions, call [phone].

## 2024-02-27 DIAGNOSIS — K5900 Constipation, unspecified: Secondary | ICD-10-CM | POA: Diagnosis not present

## 2024-02-27 DIAGNOSIS — Z1211 Encounter for screening for malignant neoplasm of colon: Secondary | ICD-10-CM | POA: Diagnosis not present

## 2024-03-03 ENCOUNTER — Other Ambulatory Visit (HOSPITAL_COMMUNITY): Payer: Self-pay

## 2024-03-05 ENCOUNTER — Other Ambulatory Visit: Payer: Self-pay | Admitting: Nurse Practitioner

## 2024-03-23 ENCOUNTER — Other Ambulatory Visit (HOSPITAL_COMMUNITY): Payer: Self-pay

## 2024-03-23 MED ORDER — PEG 3350-KCL-NA BICARB-NACL 420 G PO SOLR
ORAL | 0 refills | Status: DC
  Filled 2024-03-23: qty 4000, 2d supply, fill #0

## 2024-03-24 ENCOUNTER — Encounter (INDEPENDENT_AMBULATORY_CARE_PROVIDER_SITE_OTHER): Payer: Self-pay | Admitting: Family Medicine

## 2024-03-24 ENCOUNTER — Other Ambulatory Visit (HOSPITAL_COMMUNITY): Payer: Self-pay

## 2024-03-24 ENCOUNTER — Ambulatory Visit (INDEPENDENT_AMBULATORY_CARE_PROVIDER_SITE_OTHER): Admitting: Internal Medicine

## 2024-03-24 ENCOUNTER — Encounter (INDEPENDENT_AMBULATORY_CARE_PROVIDER_SITE_OTHER): Payer: Self-pay | Admitting: Internal Medicine

## 2024-03-24 VITALS — BP 108/77 | HR 89 | Temp 98.2°F | Ht 68.0 in | Wt 223.0 lb

## 2024-03-24 DIAGNOSIS — R638 Other symptoms and signs concerning food and fluid intake: Secondary | ICD-10-CM

## 2024-03-24 DIAGNOSIS — Z6835 Body mass index (BMI) 35.0-35.9, adult: Secondary | ICD-10-CM

## 2024-03-24 DIAGNOSIS — E66812 Obesity, class 2: Secondary | ICD-10-CM

## 2024-03-24 DIAGNOSIS — R7303 Prediabetes: Secondary | ICD-10-CM

## 2024-03-24 DIAGNOSIS — I10 Essential (primary) hypertension: Secondary | ICD-10-CM

## 2024-03-24 MED ORDER — PHENTERMINE-TOPIRAMATE ER 7.5-46 MG PO CP24
1.0000 | ORAL_CAPSULE | Freq: Every day | ORAL | 0 refills | Status: DC
  Filled 2024-03-24 – 2024-03-31 (×3): qty 30, 30d supply, fill #0

## 2024-03-24 NOTE — Assessment & Plan Note (Signed)
 Her blood pressure is well-controlled with hydrochlorothiazide  and telmisartan  within the target range. She has not experienced symptoms of hypotension such as lightheadedness or dizziness.  Continue monitoring.  Continue current regimen

## 2024-03-24 NOTE — Assessment & Plan Note (Signed)
 She is no longer on Wegovy  for pharmacoprophylaxis and may benefit from treatment with metformin.  We are currently adjusting Qsymia  which may also help reduce the risk of diabetes.  Continue to maintain a diet with a low glycemic load

## 2024-03-24 NOTE — Assessment & Plan Note (Signed)
 Presents for medical weight management. Previously on Wegovy  with a weight loss of 37 pounds, but insurance discontinued coverage. Transitioned to Qsymia , currently at 7.5 mg dose. Reports feeling full quickly despite initial hunger cues, indicating a positive response to the medication. Body composition measurements suggest a decrease in body fat percentage from 40% to 31% and an increase in muscle mass, though these results are interpreted with caution. Engages in regular cardio exercise and has increased protein intake. No adverse side effects reported from Qsymia . Discussed the importance of avoiding pregnancy while on Qsymia  due to risk of birth defects. - Continue Qsymia  at 7.5 mg dose for another month, if no weight loss increase to the next dose. - Monitor weight and body composition changes. - Encourage continuation of regular exercise and increased protein intake. - Discuss potential for dose adjustment after three months if no significant weight changes are observed. - Advise on the use of contraception to prevent pregnancy while on Qsymia .

## 2024-03-24 NOTE — Assessment & Plan Note (Signed)
 Improved now on Qsymia  previously on Wegovy .  She had increased orexigenic signaling, impaired satiety and inhibitory control. This is secondary to an abnormal energy regulation system and pathological neurohormonal pathways characteristic of excess adiposity.  In addition to nutritional and behavioral strategies she benefits from ongoing pharmacotherapy with Qsymia .  Consider increasing to the next dose if no weight loss in the next 4 weeks

## 2024-03-24 NOTE — Progress Notes (Signed)
 Office: [phone]  /  Fax: [phone]  Weight Summary and Body Composition Analysis (BIA)  Vitals Temp: 98.2 F (36.8 C) BP: 108/77 Pulse Rate: 89 SpO2: 98 %   Anthropometric Measurements Height: 5' 8 (1.727 m) Weight: 223 lb (101.2 kg) BMI (Calculated): 33.91 Weight at Last Visit: 223 lb Weight Lost Since Last Visit: 0 lb Weight Gained Since Last Visit: lb Starting Weight: 260 lb Total Weight Loss (lbs): 37 lb (16.8 kg) Peak Weight: 296 lb   Body Composition  Body Fat %: 31.8 % Fat Mass (lbs): 71.2 lbs Muscle Mass (lbs): 144.8 lbs Total Body Water (lbs): 87.4 lbs Visceral Fat Rating : 8    RMR: [id number]  Today's Visit #: 14  Starting Date: 01/06/23   Subjective   Chief Complaint: Obesity  Interval History Discussed the use of AI scribe software for clinical note transcription with the patient, who gave verbal consent to proceed.  History of Present Illness   Jadaya Sommerfield is a 53 year old female with hypertension and prediabetes who presents for medical weight management follow-up.  She was previously on Wegovy  and lost 37 pounds but had to discontinue it due to insurance coverage issues. She transitioned to Qsymia  about a month ago, which she finds effective in controlling her appetite. However, she experiences stomach growling, particularly in the mornings, and despite feeling hungry, she cannot eat much once she starts eating.  Her body fat percentage has decreased from 40% to 31%, though she is unsure about the accuracy of these numbers. Her exercise routine includes cardio three days a week for 60 minutes, with less frequent weight training due to being busy with her mother.  Her diet includes increased protein intake, with typical breakfast items being cottage cheese, [place] bacon, and a plain bagel. She also consumes more fruits and vegetables. She has not been tracking her food intake.  Her blood pressure at home has been monitored with a  cuff, and she reports it as being good, with a recent reading of 108/77. No unusual side effects from Qsymia .       Challenges affecting patient progress: strong hunger signals and/or impaired satiety / inhibitory control and having difficulties with GLP-1 or AOM coverage.    Pharmacotherapy for weight management: She is currently taking Qsymia  with adequate clinical response  and without side effects..   Assessment and Plan   Treatment Plan For Obesity:  Recommended Dietary Goals  Jaiah is currently in the action stage of change. As such, her goal is to continue weight management plan. She has agreed to: continue current plan  Behavioral Health and Counseling  We discussed the following behavioral modification strategies today: continue to work on maintaining a reduced calorie state, getting the recommended amount of protein, incorporating whole foods, making healthy choices, staying well hydrated and practicing mindfulness when eating..  Additional education and resources provided today: None  Recommended Physical Activity Goals  Shynice has been advised to work up to 150 minutes of moderate intensity aerobic activity a week and strengthening exercises 2-3 times per week for cardiovascular health, weight loss maintenance and preservation of muscle mass.   She has agreed to :  Increase the intensity, frequency or duration of strengthening exercises   Medical Interventions and Pharmacotherapy  We discussed various medication options to help Winta with her weight loss efforts and we both agreed to : Adequate clinical response to anti-obesity medication, continue current regimen  Associated Conditions Impacted by Obesity Treatment  Assessment & Plan Class  2 severe obesity with serious comorbidity and body mass index (BMI) of 35.0 to 35.9 in adult, unspecified obesity type (HCC) Presents for medical weight management. Previously on Wegovy  with a weight loss of 37 pounds, but  insurance discontinued coverage. Transitioned to Qsymia , currently at 7.5 mg dose. Reports feeling full quickly despite initial hunger cues, indicating a positive response to the medication. Body composition measurements suggest a decrease in body fat percentage from 40% to 31% and an increase in muscle mass, though these results are interpreted with caution. Engages in regular cardio exercise and has increased protein intake. No adverse side effects reported from Qsymia . Discussed the importance of avoiding pregnancy while on Qsymia  due to risk of birth defects. - Continue Qsymia  at 7.5 mg dose for another month, if no weight loss increase to the next dose. - Monitor weight and body composition changes. - Encourage continuation of regular exercise and increased protein intake. - Discuss potential for dose adjustment after three months if no significant weight changes are observed. - Advise on the use of contraception to prevent pregnancy while on Qsymia . Abnormal food appetite Improved now on Qsymia  previously on Wegovy .  She had increased orexigenic signaling, impaired satiety and inhibitory control. This is secondary to an abnormal energy regulation system and pathological neurohormonal pathways characteristic of excess adiposity.  In addition to nutritional and behavioral strategies she benefits from ongoing pharmacotherapy with Qsymia .  Consider increasing to the next dose if no weight loss in the next 4 weeks  Prediabetes She is no longer on Wegovy  for pharmacoprophylaxis and may benefit from treatment with metformin.  We are currently adjusting Qsymia  which may also help reduce the risk of diabetes.  Continue to maintain a diet with a low glycemic load Essential hypertension Her blood pressure is well-controlled with hydrochlorothiazide  and telmisartan  within the target range. She has not experienced symptoms of hypotension such as lightheadedness or dizziness.  Continue monitoring.  Continue  current regimen      Objective   Physical Exam:  Blood pressure 108/77, pulse 89, temperature 98.2 F (36.8 C), height 5' 8 (1.727 m), weight 223 lb (101.2 kg), SpO2 98%. Body mass index is 33.91 kg/m.  General: She is overweight, cooperative, alert, well developed, and in no acute distress. PSYCH: Has normal mood, affect and thought process.   HEENT: EOMI, sclerae are anicteric. Lungs: Normal breathing effort, no conversational dyspnea. Extremities: No edema.  Neurologic: No gross sensory or motor deficits. No tremors or fasciculations noted.    Diagnostic Data Reviewed:  BMET    Component Value Date/Time   NA 140 02/05/2024 0920   K 3.3 (L) 02/05/2024 0920   CL 99 02/05/2024 0920   CO2 24 02/05/2024 0920   GLUCOSE 71 02/05/2024 0920   BUN 12 02/05/2024 0920   CREATININE 0.80 02/05/2024 0920   CALCIUM 9.5 02/05/2024 0920   GFRNONAA 82 11/10/2020 1045   GFRAA 94 11/10/2020 1045   Lab Results  Component Value Date   HGBA1C 5.3 02/05/2024   HGBA1C 5.8 04/18/2018   Lab Results  Component Value Date   INSULIN  22.4 12/26/2022   INSULIN  5.6 07/12/2020   Lab Results  Component Value Date   TSH 1.110 12/26/2022   CBC    Component Value Date/Time   WBC 5.8 02/05/2024 0920   RBC 5.10 02/05/2024 0920   HGB 14.2 02/05/2024 0920   HCT 43.7 02/05/2024 0920   PLT 288 02/05/2024 0920   MCV 86 02/05/2024 0920   MCH 27.8 02/05/2024  0920   MCHC 32.5 02/05/2024 0920   RDW 13.0 02/05/2024 0920   Iron Studies No results found for: IRON, TIBC, FERRITIN, IRONPCTSAT Lipid Panel     Component Value Date/Time   CHOL 128 02/05/2024 0920   TRIG 56 02/05/2024 0920   HDL 56 02/05/2024 0920   CHOLHDL 2.3 02/05/2024 0920   LDLCALC 60 02/05/2024 0920   Hepatic Function Panel     Component Value Date/Time   PROT 7.6 02/05/2024 0920   ALBUMIN 4.5 02/05/2024 0920   AST 18 02/05/2024 0920   ALT 20 02/05/2024 0920   ALKPHOS 88 02/05/2024 0920   BILITOT 0.4  02/05/2024 0920      Component Value Date/Time   TSH 1.110 12/26/2022 0927   Nutritional Lab Results  Component Value Date   VD25OH 40.4 02/05/2024   VD25OH 45.4 07/25/2023   VD25OH 44.1 12/10/2022    Medications: Outpatient Encounter Medications as of 03/24/2024  Medication Sig   cetirizine  (ZYRTEC ) 10 MG tablet TAKE 1 TABLET(10 MG) BY MOUTH DAILY   EPINEPHrine  0.3 mg/0.3 mL IJ SOAJ injection Inject 0.3 mg into the muscle as needed for anaphylaxis. Use as directed   ergocalciferol  (VITAMIN D2) 1.25 MG (50000 UT) capsule Take 1 capsule (50,000 Units total) by mouth once a week.   hydrochlorothiazide  (HYDRODIURIL ) 25 MG tablet Take 1 tablet (25 mg total) by mouth daily.   ibuprofen  (ADVIL ) 800 MG tablet TAKE 1 TABLET BY MOUTH 3 TIMES A DAY WITH FOOD AS NEEDED   polyethylene glycol-electrolytes (NULYTELY) 420 g solution Use as directed   telmisartan  (MICARDIS ) 40 MG tablet Take 1 tablet (40 mg total) by mouth daily.   [DISCONTINUED] Phentermine -Topiramate  ER 7.5-46 MG CP24 Take 1 capsule by mouth daily.   Phentermine -Topiramate  ER 7.5-46 MG CP24 Take 1 capsule by mouth daily.   [DISCONTINUED] Phentermine -Topiramate  (QSYMIA ) 3.75-23 MG CP24 Take 1 capsule by mouth daily.   No facility-administered encounter medications on file as of 03/24/2024.     Follow-Up   Return in about 4 weeks (around 04/21/2024) for For Weight Mangement with Dr. Francyne.SABRA She was informed of the importance of frequent follow up visits to maximize her success with intensive lifestyle modifications for her multiple health conditions.  Attestation Statement   Reviewed by clinician on day of visit: allergies, medications, problem list, medical history, surgical history, family history, social history, and previous encounter notes.     Lucas Francyne, MD

## 2024-03-24 NOTE — Progress Notes (Deleted)
 Office: [phone]  /  Fax: [phone]  Weight Summary and Body Composition Analysis (BIA)  Vitals Temp: 98.2 F (36.8 C) BP: 108/77 Pulse Rate: 89 SpO2: 98 %   Anthropometric Measurements Height: 5' 8 (1.727 m) Weight: 223 lb (101.2 kg) BMI (Calculated): 33.91 Weight at Last Visit: 223 lb Weight Lost Since Last Visit: 0 lb Weight Gained Since Last Visit: lb Starting Weight: 260 lb Total Weight Loss (lbs): 37 lb (16.8 kg) Peak Weight: 296 lb   Body Composition  Body Fat %: 31.8 % Fat Mass (lbs): 71.2 lbs Muscle Mass (lbs): 144.8 lbs Total Body Water (lbs): 87.4 lbs Visceral Fat Rating : 8    RMR: 2074  Today's Visit #: 14  Starting Date: 01/06/23   Subjective   Chief Complaint: Obesity  Interval History ***  Challenges affecting patient progress: {EMOBESITYBARRIERS:28841::none}.    Pharmacotherapy for weight management: She is currently taking {EMPharmaco:28845}.   Assessment and Plan   Treatment Plan For Obesity:  Recommended Dietary Goals  Kymari is currently in the action stage of change. As such, her goal is to continue weight management plan. She has agreed to: {EMWTLOSSPLAN:29297::continue current plan}  Behavioral Health and Counseling  We discussed the following behavioral modification strategies today: {EMWMwtlossstrategies:28914::continue to work on maintaining a reduced calorie state, getting the recommended amount of protein, incorporating whole foods, making healthy choices, staying well hydrated and practicing mindfulness when eating.}.  Additional education and resources provided today: {EMadditionalresources:29169::None}  Recommended Physical Activity Goals  Cicily has been advised to work up to 150 minutes of moderate intensity aerobic activity a week and strengthening exercises 2-3 times per week for cardiovascular health, weight loss maintenance and preservation of muscle mass.   She has agreed to :   {EMEXERCISE:28847::Think about enjoyable ways to increase daily physical activity and overcoming barriers to exercise,Increase physical activity in their day and reduce sedentary time (increase NEAT).}  Medical Interventions and Pharmacotherapy  We discussed various medication options to help Ninel with her weight loss efforts and we both agreed to : {EMagreedrx:29170}  Associated Conditions Impacted by Obesity Treatment  Assessment & Plan Class 2 severe obesity with serious comorbidity and body mass index (BMI) of 35.0 to 35.9 in adult, unspecified obesity type (HCC)  Abnormal food appetite  Prediabetes  Essential hypertension     ***  Objective   Physical Exam:  Blood pressure 108/77, pulse 89, temperature 98.2 F (36.8 C), height 5' 8 (1.727 m), weight 223 lb (101.2 kg), SpO2 98%. Body mass index is 33.91 kg/m.  General: She is overweight, cooperative, alert, well developed, and in no acute distress. PSYCH: Has normal mood, affect and thought process.   HEENT: EOMI, sclerae are anicteric. Lungs: Normal breathing effort, no conversational dyspnea. Extremities: No edema.  Neurologic: No gross sensory or motor deficits. No tremors or fasciculations noted.    Diagnostic Data Reviewed:  BMET    Component Value Date/Time   NA 140 02/05/2024 0920   K 3.3 (L) 02/05/2024 0920   CL 99 02/05/2024 0920   CO2 24 02/05/2024 0920   GLUCOSE 71 02/05/2024 0920   BUN 12 02/05/2024 0920   CREATININE 0.80 02/05/2024 0920   CALCIUM 9.5 02/05/2024 0920   GFRNONAA 82 11/10/2020 1045   GFRAA 94 11/10/2020 1045   Lab Results  Component Value Date   HGBA1C 5.3 02/05/2024   HGBA1C 5.8 04/18/2018   Lab Results  Component Value Date   INSULIN  22.4 12/26/2022   INSULIN  5.6 07/12/2020   Lab  Results  Component Value Date   TSH 1.110 12/26/2022   CBC    Component Value Date/Time   WBC 5.8 02/05/2024 0920   RBC 5.10 02/05/2024 0920   HGB 14.2 02/05/2024 0920   HCT  43.7 02/05/2024 0920   PLT 288 02/05/2024 0920   MCV 86 02/05/2024 0920   MCH 27.8 02/05/2024 0920   MCHC 32.5 02/05/2024 0920   RDW 13.0 02/05/2024 0920   Iron Studies No results found for: IRON, TIBC, FERRITIN, IRONPCTSAT Lipid Panel     Component Value Date/Time   CHOL 128 02/05/2024 0920   TRIG 56 02/05/2024 0920   HDL 56 02/05/2024 0920   CHOLHDL 2.3 02/05/2024 0920   LDLCALC 60 02/05/2024 0920   Hepatic Function Panel     Component Value Date/Time   PROT 7.6 02/05/2024 0920   ALBUMIN 4.5 02/05/2024 0920   AST 18 02/05/2024 0920   ALT 20 02/05/2024 0920   ALKPHOS 88 02/05/2024 0920   BILITOT 0.4 02/05/2024 0920      Component Value Date/Time   TSH 1.110 12/26/2022 0927   Nutritional Lab Results  Component Value Date   VD25OH 40.4 02/05/2024   VD25OH 45.4 07/25/2023   VD25OH 44.1 12/10/2022    Medications: Outpatient Encounter Medications as of 03/24/2024  Medication Sig   cetirizine  (ZYRTEC ) 10 MG tablet TAKE 1 TABLET(10 MG) BY MOUTH DAILY   EPINEPHrine  0.3 mg/0.3 mL IJ SOAJ injection Inject 0.3 mg into the muscle as needed for anaphylaxis. Use as directed   ergocalciferol  (VITAMIN D2) 1.25 MG (50000 UT) capsule Take 1 capsule (50,000 Units total) by mouth once a week.   hydrochlorothiazide  (HYDRODIURIL ) 25 MG tablet Take 1 tablet (25 mg total) by mouth daily.   ibuprofen  (ADVIL ) 800 MG tablet TAKE 1 TABLET BY MOUTH 3 TIMES A DAY WITH FOOD AS NEEDED   Phentermine -Topiramate  ER 7.5-46 MG CP24 Take 1 capsule by mouth daily.   polyethylene glycol-electrolytes (NULYTELY) 420 g solution Use as directed   telmisartan  (MICARDIS ) 40 MG tablet Take 1 tablet (40 mg total) by mouth daily.   [DISCONTINUED] Phentermine -Topiramate  (QSYMIA ) 3.75-23 MG CP24 Take 1 capsule by mouth daily.   No facility-administered encounter medications on file as of 03/24/2024.     Follow-Up   No follow-ups on file.SABRA She was informed of the importance of frequent follow up visits to  maximize her success with intensive lifestyle modifications for her multiple health conditions.  Attestation Statement   Reviewed by clinician on day of visit: allergies, medications, problem list, medical history, surgical history, family history, social history, and previous encounter notes.     [name], MD

## 2024-03-27 ENCOUNTER — Other Ambulatory Visit (HOSPITAL_COMMUNITY): Payer: Self-pay

## 2024-03-31 ENCOUNTER — Other Ambulatory Visit (HOSPITAL_COMMUNITY): Payer: Self-pay

## 2024-04-01 ENCOUNTER — Other Ambulatory Visit (HOSPITAL_COMMUNITY): Payer: Self-pay

## 2024-04-22 ENCOUNTER — Ambulatory Visit (INDEPENDENT_AMBULATORY_CARE_PROVIDER_SITE_OTHER): Admitting: Internal Medicine

## 2024-04-22 ENCOUNTER — Encounter (INDEPENDENT_AMBULATORY_CARE_PROVIDER_SITE_OTHER): Payer: Self-pay | Admitting: Internal Medicine

## 2024-04-22 ENCOUNTER — Other Ambulatory Visit (HOSPITAL_COMMUNITY): Payer: Self-pay

## 2024-04-22 VITALS — BP 105/74 | HR 92 | Temp 98.4°F | Ht 68.0 in | Wt 227.0 lb

## 2024-04-22 DIAGNOSIS — R7303 Prediabetes: Secondary | ICD-10-CM

## 2024-04-22 DIAGNOSIS — R638 Other symptoms and signs concerning food and fluid intake: Secondary | ICD-10-CM | POA: Diagnosis not present

## 2024-04-22 DIAGNOSIS — Z6835 Body mass index (BMI) 35.0-35.9, adult: Secondary | ICD-10-CM

## 2024-04-22 DIAGNOSIS — E66812 Obesity, class 2: Secondary | ICD-10-CM

## 2024-04-22 DIAGNOSIS — I10 Essential (primary) hypertension: Secondary | ICD-10-CM

## 2024-04-22 MED ORDER — PHENTERMINE-TOPIRAMATE ER 11.25-69 MG PO CP24
1.0000 | ORAL_CAPSULE | Freq: Every day | ORAL | 0 refills | Status: DC
  Filled 2024-04-22: qty 30, 30d supply, fill #0

## 2024-04-22 NOTE — Assessment & Plan Note (Signed)
 Her blood pressure is well-controlled with hydrochlorothiazide  and telmisartan  within the target range. She has not experienced symptoms of hypotension such as lightheadedness or dizziness.  Continue monitoring.  Continue current regimen

## 2024-04-22 NOTE — Assessment & Plan Note (Signed)
 Improved now on Qsymia  previously on Wegovy .  She had increased orexigenic signaling, impaired satiety and inhibitory control. This is secondary to an abnormal energy regulation system and pathological neurohormonal pathways characteristic of excess adiposity.  In addition to nutritional and behavioral strategies she benefits from ongoing pharmacotherapy with Qsymia .  We will increase medication to 11.25 mg

## 2024-04-22 NOTE — Progress Notes (Signed)
 Office: [phone]  /  Fax: [phone]  Weight Summary and Body Composition Analysis (BIA)  Vitals Temp: 98.4 F (36.9 C) BP: 105/74 Pulse Rate: 92 SpO2: 97 %   Anthropometric Measurements Height: 5' 8 (1.727 m) Weight: 227 lb (103 kg) BMI (Calculated): 34.52 Weight at Last Visit: 223 lb Weight Lost Since Last Visit: 0 lb Weight Gained Since Last Visit: 4 lb Starting Weight: 260 lb Total Weight Loss (lbs): 33 lb (15 kg) Peak Weight: 296 lb   Body Composition  Body Fat %: 41.1 % Fat Mass (lbs): 93.2 lbs Muscle Mass (lbs): 127 lbs Total Body Water (lbs): 84.4 lbs Visceral Fat Rating : 11    RMR: 2074  Today's Visit #: 15  Starting Date: 01/06/23   Subjective   Chief Complaint: Obesity  Interval History Patient presents today for medical weight management.  Associated conditions include prediabetes, hypertension and abnormal food appetite.  Since last office visit she has gained 4 pounds.  She been told to hold her antiobesity medication in anticipation to colonoscopy.  She had been on Qsymia  7.5 mg once a day.  She denies any adverse effects.  She reports following a 1500-calorie nutrition plan with good adherence.  She is not tracking calories reports eating more whole foods getting the recommended amount of protein and maintaining adequate hydration she reports adequate sleep and has been exercising 3 days a week about 60 minutes doing a combination of strength and cardio.  Patient had been on Wegovy  but medication is no longer covered by her insurance that she will started on Qsymia .  Challenges affecting patient progress: none.    Pharmacotherapy for weight management: She is currently taking Qsymia  with adequate clinical response , without side effects., and paying for medication out-of-pocket.   Assessment and Plan   Treatment Plan For Obesity:  Recommended Dietary Goals  Sofya is currently in the action stage of change. As such, her goal is to  continue weight management plan. She has agreed to: continue current plan  Behavioral Health and Counseling  We discussed the following behavioral modification strategies today: continue to work on maintaining a reduced calorie state, getting the recommended amount of protein, incorporating whole foods, making healthy choices, staying well hydrated and practicing mindfulness when eating..  Additional education and resources provided today: None  Recommended Physical Activity Goals  Cathlyn has been advised to work up to 150 minutes of moderate intensity aerobic activity a week and strengthening exercises 2-3 times per week for cardiovascular health, weight loss maintenance and preservation of muscle mass.   She has agreed to :  Continue current level of physical activity   Medical Interventions and Pharmacotherapy  We discussed various medication options to help Shawnise with her weight loss efforts and we both agreed to : Increase increase Qsymia  to 11.25 mg once a day.  Heart rate and blood pressure are well-controlled.  She is not experiencing any medication side effects.  She is low risk for pregnancy  Associated Conditions Impacted by Obesity Treatment  Assessment & Plan Class 2 severe obesity with serious comorbidity and body mass index (BMI) of 35.0 to 35.9 in adult, unspecified obesity type (HCC) -Continue reduced calorie nutrition plan and physical activity -Increase Qsymia  to 11.25 mg once a week -Had been on Wegovy  previously no longer covered by her insurance Abnormal food appetite Improved now on Qsymia  previously on Wegovy .  She had increased orexigenic signaling, impaired satiety and inhibitory control. This is secondary to an abnormal energy regulation  system and pathological neurohormonal pathways characteristic of excess adiposity.  In addition to nutritional and behavioral strategies she benefits from ongoing pharmacotherapy with Qsymia .  We will increase medication to  11.25 mg  Essential hypertension Her blood pressure is well-controlled with hydrochlorothiazide  and telmisartan  within the target range. She has not experienced symptoms of hypotension such as lightheadedness or dizziness.  Continue monitoring.  Continue current regimen Prediabetes She is no longer on Wegovy  for pharmacoprophylaxis and may benefit from treatment with metformin.  We are currently adjusting Qsymia  which may also help reduce the risk of diabetes.  Continue to maintain a diet with a low glycemic load      Objective   Physical Exam:  Blood pressure 105/74, pulse 92, temperature 98.4 F (36.9 C), height 5' 8 (1.727 m), weight 227 lb (103 kg), last menstrual period 04/01/2024, SpO2 97%. Body mass index is 34.52 kg/m.  General: She is overweight, cooperative, alert, well developed, and in no acute distress. PSYCH: Has normal mood, affect and thought process.   HEENT: EOMI, sclerae are anicteric. Lungs: Normal breathing effort, no conversational dyspnea. Extremities: No edema.  Neurologic: No gross sensory or motor deficits. No tremors or fasciculations noted.    Diagnostic Data Reviewed:  BMET    Component Value Date/Time   NA 140 02/05/2024 0920   K 3.3 (L) 02/05/2024 0920   CL 99 02/05/2024 0920   CO2 24 02/05/2024 0920   GLUCOSE 71 02/05/2024 0920   BUN 12 02/05/2024 0920   CREATININE 0.80 02/05/2024 0920   CALCIUM 9.5 02/05/2024 0920   GFRNONAA 82 11/10/2020 1045   GFRAA 94 11/10/2020 1045   Lab Results  Component Value Date   HGBA1C 5.3 02/05/2024   HGBA1C 5.8 04/18/2018   Lab Results  Component Value Date   INSULIN  22.4 12/26/2022   INSULIN  5.6 07/12/2020   Lab Results  Component Value Date   TSH 1.110 12/26/2022   CBC    Component Value Date/Time   WBC 5.8 02/05/2024 0920   RBC 5.10 02/05/2024 0920   HGB 14.2 02/05/2024 0920   HCT 43.7 02/05/2024 0920   PLT 288 02/05/2024 0920   MCV 86 02/05/2024 0920   MCH 27.8 02/05/2024 0920   MCHC  32.5 02/05/2024 0920   RDW 13.0 02/05/2024 0920   Iron Studies No results found for: IRON, TIBC, FERRITIN, IRONPCTSAT Lipid Panel     Component Value Date/Time   CHOL 128 02/05/2024 0920   TRIG 56 02/05/2024 0920   HDL 56 02/05/2024 0920   CHOLHDL 2.3 02/05/2024 0920   LDLCALC 60 02/05/2024 0920   Hepatic Function Panel     Component Value Date/Time   PROT 7.6 02/05/2024 0920   ALBUMIN 4.5 02/05/2024 0920   AST 18 02/05/2024 0920   ALT 20 02/05/2024 0920   ALKPHOS 88 02/05/2024 0920   BILITOT 0.4 02/05/2024 0920      Component Value Date/Time   TSH 1.110 12/26/2022 0927   Nutritional Lab Results  Component Value Date   VD25OH 40.4 02/05/2024   VD25OH 45.4 07/25/2023   VD25OH 44.1 12/10/2022    Medications: Outpatient Encounter Medications as of 04/22/2024  Medication Sig   cetirizine  (ZYRTEC ) 10 MG tablet TAKE 1 TABLET(10 MG) BY MOUTH DAILY   EPINEPHrine  0.3 mg/0.3 mL IJ SOAJ injection Inject 0.3 mg into the muscle as needed for anaphylaxis. Use as directed   ergocalciferol  (VITAMIN D2) 1.25 MG (50000 UT) capsule Take 1 capsule (50,000 Units total) by mouth once a week.  hydrochlorothiazide  (HYDRODIURIL ) 25 MG tablet Take 1 tablet (25 mg total) by mouth daily.   ibuprofen  (ADVIL ) 800 MG tablet TAKE 1 TABLET BY MOUTH 3 TIMES A DAY WITH FOOD AS NEEDED   Phentermine -Topiramate  ER 11.25-69 MG CP24 Take 1 tablet by mouth daily.   polyethylene glycol-electrolytes (NULYTELY) 420 g solution Use as directed   telmisartan  (MICARDIS ) 40 MG tablet Take 1 tablet (40 mg total) by mouth daily.   [DISCONTINUED] Phentermine -Topiramate  ER 7.5-46 MG CP24 Take 1 capsule by mouth daily.   No facility-administered encounter medications on file as of 04/22/2024.     Follow-Up   Return in about 4 weeks (around 05/20/2024) for For Weight Mangement with Dr. Francyne.SABRA She was informed of the importance of frequent follow up visits to maximize her success with intensive lifestyle  modifications for her multiple health conditions.  Attestation Statement   Reviewed by clinician on day of visit: allergies, medications, problem list, medical history, surgical history, family history, social history, and previous encounter notes.     Lucas Francyne, MD

## 2024-04-22 NOTE — Assessment & Plan Note (Signed)
 She is no longer on Wegovy  for pharmacoprophylaxis and may benefit from treatment with metformin.  We are currently adjusting Qsymia  which may also help reduce the risk of diabetes.  Continue to maintain a diet with a low glycemic load

## 2024-04-22 NOTE — Assessment & Plan Note (Signed)
-  Continue reduced calorie nutrition plan and physical activity -Increase Qsymia  to 11.25 mg once a week -Had been on Wegovy  previously no longer covered by her insurance

## 2024-04-23 ENCOUNTER — Other Ambulatory Visit (HOSPITAL_COMMUNITY): Payer: Self-pay

## 2024-04-29 DIAGNOSIS — I10 Essential (primary) hypertension: Secondary | ICD-10-CM | POA: Diagnosis not present

## 2024-04-29 DIAGNOSIS — K635 Polyp of colon: Secondary | ICD-10-CM | POA: Diagnosis not present

## 2024-04-29 DIAGNOSIS — Z860100 Personal history of colon polyps, unspecified: Secondary | ICD-10-CM | POA: Diagnosis not present

## 2024-04-29 DIAGNOSIS — Z1211 Encounter for screening for malignant neoplasm of colon: Secondary | ICD-10-CM | POA: Diagnosis not present

## 2024-05-28 ENCOUNTER — Other Ambulatory Visit (HOSPITAL_COMMUNITY): Payer: Self-pay

## 2024-05-28 ENCOUNTER — Ambulatory Visit (INDEPENDENT_AMBULATORY_CARE_PROVIDER_SITE_OTHER): Admitting: Internal Medicine

## 2024-05-28 ENCOUNTER — Encounter (INDEPENDENT_AMBULATORY_CARE_PROVIDER_SITE_OTHER): Payer: Self-pay | Admitting: Internal Medicine

## 2024-05-28 VITALS — BP 106/74 | HR 87 | Temp 98.2°F | Ht 68.0 in | Wt 228.0 lb

## 2024-05-28 DIAGNOSIS — Z6834 Body mass index (BMI) 34.0-34.9, adult: Secondary | ICD-10-CM

## 2024-05-28 DIAGNOSIS — I10 Essential (primary) hypertension: Secondary | ICD-10-CM

## 2024-05-28 DIAGNOSIS — E66812 Obesity, class 2: Secondary | ICD-10-CM

## 2024-05-28 DIAGNOSIS — R7303 Prediabetes: Secondary | ICD-10-CM

## 2024-05-28 DIAGNOSIS — R638 Other symptoms and signs concerning food and fluid intake: Secondary | ICD-10-CM

## 2024-05-28 DIAGNOSIS — Z6835 Body mass index (BMI) 35.0-35.9, adult: Secondary | ICD-10-CM

## 2024-05-28 MED ORDER — PHENTERMINE-TOPIRAMATE ER 11.25-69 MG PO CP24
1.0000 | ORAL_CAPSULE | Freq: Every day | ORAL | 0 refills | Status: DC
  Filled 2024-05-28: qty 30, 30d supply, fill #0

## 2024-05-28 NOTE — Assessment & Plan Note (Signed)
 Her blood pressure is well-controlled with hydrochlorothiazide  and telmisartan  within the target range.  Her blood pressure and heart rate have not been affected by phentermine .  She has not experienced symptoms of hypotension such as lightheadedness or dizziness.  Continue monitoring.  Continue current regimen

## 2024-05-28 NOTE — Assessment & Plan Note (Signed)
 She is no longer on Wegovy  for pharmacoprophylaxis and may benefit from treatment with metformin.  We are currently adjusting Qsymia  which may also help reduce the risk of diabetes.  Continue to maintain a diet with a low glycemic load.  Consider adding metformin for diabetes prevention and weight management

## 2024-05-28 NOTE — Progress Notes (Signed)
 Office: [phone]  /  Fax: [phone]  Weight Summary And Body Composition Analysis (BIA)  Vitals Temp: 98.2 F (36.8 C) BP: 106/74 Pulse Rate: 87 SpO2: 96 %   Anthropometric Measurements Height: 5' 8 (1.727 m) Weight: 228 lb (103.4 kg) BMI (Calculated): 34.68 Weight at Last Visit: 227 lb Weight Lost Since Last Visit: 0 lb Weight Gained Since Last Visit: 1 lb Starting Weight: 260 lb Total Weight Loss (lbs): 32 lb (14.5 kg) Peak Weight: 296 lb   Body Composition  Body Fat %: 40 % Fat Mass (lbs): 91.6 lbs Muscle Mass (lbs): 130.2 lbs Total Body Water (lbs): 84.6 lbs Visceral Fat Rating : 11    RMR: 2074  Today's Visit #: 16  Starting Date: 01/06/23   Subjective   Chief Complaint: Obesity  Jane Armstrong is here to discuss her progress with her obesity treatment plan. She is following the Category 3 plan - 1500 kcal per day and states she is following her eating plan approximately 50-60% of the time. She states she is not exercising.  Weight Progress Since Last Visit:  Since last office visit she has gained 1 pounds. She reports fair adherence to reduced calorie nutritional plan. She has been working on reading food labels, not skipping meals, increasing protein intake at every meal, drinking more water, making healthier choices, reducing portion sizes, and incorporating more whole foods   Nutritional 24 HR Recall: Intake consistent with prescribed nutritional plan  Challenges affecting patient progress: low volume of physical activity at present , having difficulties with GLP-1 or AOM coverage, and coming off GLP-1.   Orexigenic Control: Reports improved problems with appetite and hunger signals.  Reports improved problems with satiety and satiation.  Denies problems with eating patterns and portion control.  Denies abnormal cravings. Denies feeling deprived or restricted.   Pharmacotherapy for weight management: She is currently taking Qsymia  with  adequate clinical response  and without side effects. and was previously on Wegovy .   Assessment and Plan   Treatment Plan For Obesity:  Recommended Dietary Goals  Jane Armstrong is currently in the action stage of change. As such, her goal is to continue weight management plan. She has agreed to: She will continue current calorie target of 1500 cal with 90 to 120 g of protein per day I did provide her with a my phenome plan encouraging 5 small meals a day with Premeal protein snacks for increased satiation.  She will also continue to work on increasing fiber intake to about 25 g/day  [employer] and Counseling  We discussed the following behavioral modification strategies today: continue to work on maintaining a reduced calorie state, getting the recommended amount of protein, incorporating whole foods, making healthy choices, staying well hydrated and practicing mindfulness when eating. and increase protein intake, fibrous foods (25 grams per day for women, 30 grams for men) and water to improve satiety and decrease hunger signals. .  Additional education and resources provided today: None  Recommended Physical Activity Goals  Jane Armstrong has been advised to work up to 150 minutes of moderate intensity aerobic activity a week and strengthening exercises 2-3 times per week for cardiovascular health, weight loss maintenance and preservation of muscle mass.   She has agreed to :  Think about enjoyable ways to increase daily physical activity and overcoming barriers to exercise, Increase physical activity in their day and reduce sedentary time (increase NEAT)., Increase volume of physical activity to a goal of 240 minutes a week, and Combine aerobic and  strengthening exercises for efficiency and improved cardiometabolic health.  Pharmacotherapy and Medical Interventions  Adequate clinical response to anti-obesity medication, continue current regimen  Associated Conditions Impacted by Obesity  Treatment  Assessment & Plan Essential hypertension Her blood pressure is well-controlled with hydrochlorothiazide  and telmisartan  within the target range.  Her blood pressure and heart rate have not been affected by phentermine .  She has not experienced symptoms of hypotension such as lightheadedness or dizziness.  Continue monitoring.  Continue current regimen Prediabetes She is no longer on Wegovy  for pharmacoprophylaxis and may benefit from treatment with metformin.  We are currently adjusting Qsymia  which may also help reduce the risk of diabetes.  Continue to maintain a diet with a low glycemic load.  Consider adding metformin for diabetes prevention and weight management Class 2 severe obesity with serious comorbidity and body mass index (BMI) of 35.0 to 35.9 in adult, unspecified obesity type (HCC) Abnormal food appetite Labs today weight: decrease of 36 lb (13.6%) over 1 year, 5 months  Start: 12/10/2022 264 lb (119.7 kg)  End: 05/28/2024 228 lb (103.4 kg)   - Experiencing some weight regain after discontinuation of Wegovy  back in May now on Qsymia  with improved orixegenic control Continue reduced calorie nutrition plan and physical activity - Continue Qsymia  to 11.25 mg once a week -Had been on Wegovy  previously no longer covered by her insurance -Provided with a new 1500-calorie high-protein meal plan emphasizing 5 small meals a day with 2 out of the 5 Premeal protein snacks    Objective   Physical Exam:  Blood pressure 106/74, pulse 87, temperature 98.2 F (36.8 C), height 5' 8 (1.727 m), weight 228 lb (103.4 kg), SpO2 96%. Body mass index is 34.67 kg/m.  General: She is overweight, cooperative, alert, well developed, and in no acute distress. PSYCH: Has normal mood, affect and thought process.   HEENT: EOMI, sclerae are anicteric. Lungs: Normal breathing effort, no conversational dyspnea. Extremities: No edema.  Neurologic: No gross sensory or motor deficits. No tremors  or fasciculations noted.    Diagnostic Data Reviewed:  BMET    Component Value Date/Time   NA 140 02/05/2024 0920   K 3.3 (L) 02/05/2024 0920   CL 99 02/05/2024 0920   CO2 24 02/05/2024 0920   GLUCOSE 71 02/05/2024 0920   BUN 12 02/05/2024 0920   CREATININE 0.80 02/05/2024 0920   CALCIUM 9.5 02/05/2024 0920   GFRNONAA 82 11/10/2020 1045   GFRAA 94 11/10/2020 1045   Lab Results  Component Value Date   HGBA1C 5.3 02/05/2024   HGBA1C 5.8 04/18/2018   Lab Results  Component Value Date   INSULIN  22.4 12/26/2022   INSULIN  5.6 07/12/2020   Lab Results  Component Value Date   TSH 1.110 12/26/2022   CBC    Component Value Date/Time   WBC 5.8 02/05/2024 0920   RBC 5.10 02/05/2024 0920   HGB 14.2 02/05/2024 0920   HCT 43.7 02/05/2024 0920   PLT 288 02/05/2024 0920   MCV 86 02/05/2024 0920   MCH 27.8 02/05/2024 0920   MCHC 32.5 02/05/2024 0920   RDW 13.0 02/05/2024 0920   Iron Studies No results found for: IRON, TIBC, FERRITIN, IRONPCTSAT Lipid Panel     Component Value Date/Time   CHOL 128 02/05/2024 0920   TRIG 56 02/05/2024 0920   HDL 56 02/05/2024 0920   CHOLHDL 2.3 02/05/2024 0920   LDLCALC 60 02/05/2024 0920   Hepatic Function Panel     Component Value Date/Time  PROT 7.6 02/05/2024 0920   ALBUMIN 4.5 02/05/2024 0920   AST 18 02/05/2024 0920   ALT 20 02/05/2024 0920   ALKPHOS 88 02/05/2024 0920   BILITOT 0.4 02/05/2024 0920      Component Value Date/Time   TSH 1.110 12/26/2022 0927   Nutritional Lab Results  Component Value Date   VD25OH 40.4 02/05/2024   VD25OH 45.4 07/25/2023   VD25OH 44.1 12/10/2022    Medications: Outpatient Encounter Medications as of 05/28/2024  Medication Sig   cetirizine  (ZYRTEC ) 10 MG tablet TAKE 1 TABLET(10 MG) BY MOUTH DAILY   EPINEPHrine  0.3 mg/0.3 mL IJ SOAJ injection Inject 0.3 mg into the muscle as needed for anaphylaxis. Use as directed   ergocalciferol  (VITAMIN D2) 1.25 MG (50000 UT) capsule Take 1  capsule (50,000 Units total) by mouth once a week.   hydrochlorothiazide  (HYDRODIURIL ) 25 MG tablet Take 1 tablet (25 mg total) by mouth daily.   ibuprofen  (ADVIL ) 800 MG tablet TAKE 1 TABLET BY MOUTH 3 TIMES A DAY WITH FOOD AS NEEDED   Phentermine -Topiramate  ER 11.25-69 MG CP24 Take 1 capsule by mouth daily.   polyethylene glycol-electrolytes (NULYTELY) 420 g solution Use as directed   telmisartan  (MICARDIS ) 40 MG tablet Take 1 tablet (40 mg total) by mouth daily.   [DISCONTINUED] Phentermine -Topiramate  ER 11.25-69 MG CP24 Take 1 capsule by mouth daily.   No facility-administered encounter medications on file as of 05/28/2024.     Follow-Up   Return in about 4 weeks (around 06/25/2024).SABRA She was informed of the importance of frequent follow up visits to maximize her success with intensive lifestyle modifications for her multiple health conditions.  Attestation Statement   Reviewed by clinician on day of visit: allergies, medications, problem list, medical history, surgical history, family history, social history, and previous encounter notes.     [name], MD

## 2024-05-28 NOTE — Assessment & Plan Note (Signed)
 Labs today weight: decrease of 36 lb (13.6%) over 1 year, 5 months  Start: 12/10/2022 264 lb (119.7 kg)  End: 05/28/2024 228 lb (103.4 kg)   - Experiencing some weight regain after discontinuation of Wegovy  back in May now on Qsymia  with improved orixegenic control Continue reduced calorie nutrition plan and physical activity - Continue Qsymia  to 11.25 mg once a week -Had been on Wegovy  previously no longer covered by her insurance -Provided with a new 1500-calorie high-protein meal plan emphasizing 5 small meals a day with 2 out of the 5 Premeal protein snacks

## 2024-05-29 ENCOUNTER — Other Ambulatory Visit (HOSPITAL_COMMUNITY): Payer: Self-pay

## 2024-06-18 ENCOUNTER — Other Ambulatory Visit: Payer: Self-pay | Admitting: Nurse Practitioner

## 2024-06-24 ENCOUNTER — Other Ambulatory Visit: Payer: Self-pay

## 2024-06-24 MED ORDER — CETIRIZINE HCL 10 MG PO TABS: 10.0000 mg | ORAL_TABLET | Freq: Every day | ORAL | 1 refills | Status: DC

## 2024-06-29 ENCOUNTER — Other Ambulatory Visit (HOSPITAL_COMMUNITY): Payer: Self-pay

## 2024-06-29 ENCOUNTER — Encounter (INDEPENDENT_AMBULATORY_CARE_PROVIDER_SITE_OTHER): Payer: Self-pay | Admitting: Internal Medicine

## 2024-06-29 ENCOUNTER — Ambulatory Visit (INDEPENDENT_AMBULATORY_CARE_PROVIDER_SITE_OTHER): Admitting: Internal Medicine

## 2024-06-29 VITALS — BP 114/80 | HR 68 | Temp 98.2°F | Ht 68.0 in | Wt 232.0 lb

## 2024-06-29 DIAGNOSIS — R7303 Prediabetes: Secondary | ICD-10-CM | POA: Diagnosis not present

## 2024-06-29 DIAGNOSIS — Z6835 Body mass index (BMI) 35.0-35.9, adult: Secondary | ICD-10-CM | POA: Diagnosis not present

## 2024-06-29 DIAGNOSIS — E66812 Obesity, class 2: Secondary | ICD-10-CM

## 2024-06-29 DIAGNOSIS — R638 Other symptoms and signs concerning food and fluid intake: Secondary | ICD-10-CM | POA: Diagnosis not present

## 2024-06-29 MED ORDER — PHENTERMINE-TOPIRAMATE ER 15-92 MG PO CP24
1.0000 | ORAL_CAPSULE | Freq: Every day | ORAL | 0 refills | Status: DC
  Filled 2024-06-29: qty 30, 30d supply, fill #0

## 2024-06-29 NOTE — Assessment & Plan Note (Signed)
 Improved.  Previously on Wegovy  for pharmacoprophylaxis.  Consider adding metformin in the future.

## 2024-06-29 NOTE — Assessment & Plan Note (Signed)
 Weight: decrease of 41 lb (15.5%) over 1 year, 2 months  Start: 12/10/2022 264 lb (119.7 kg)  End: 02/13/2024 223 lb (101.2 kg)  With 9 pound weight regain, and off Wegovy . Bioimpedance information reviewed unreliable results is not consistent with previous trends. Check indirect calorimetry at the next office visit to assess for adaptive thermogenesis Continue current reduced calorie nutrition plan of 1500 cal Increase volume of physical activity to 240 minutes a week Increase Qsymia  to 15 mg / 92 mg once a day.  Blood pressure and heart rate are well-controlled.  She is low risk for pregnancy.  No signs of developing tolerance. PDMP reviewed during this encounter.

## 2024-06-29 NOTE — Progress Notes (Signed)
 Office: [phone]  /  Fax: [phone]  Weight Summary and Body Composition Analysis (BIA)  Vitals Temp: 98.2 F (36.8 C) BP: 114/80 Pulse Rate: 68 SpO2: 96 %   Anthropometric Measurements Height: 5' 8 (1.727 m) Weight: 232 lb (105.2 kg) BMI (Calculated): 35.28 Weight at Last Visit: 228 lb Weight Lost Since Last Visit: 0 lb Weight Gained Since Last Visit: 4 lb Starting Weight: 260 lb Total Weight Loss (lbs): 28 lb (12.7 kg) Peak Weight: 296   Body Composition  Body Fat %: 42.3 % Fat Mass (lbs): 98.4 lbs Muscle Mass (lbs): 127.4 lbs Total Body Water (lbs): 85.6 lbs Visceral Fat Rating : 11    RMR: 2074  Today's Visit #: 17  Starting Date: 01/06/23   Subjective   Chief Complaint: Obesity  Interval History Discussed the use of AI scribe software for clinical note transcription with the patient, who gave verbal consent to proceed.  History of Present Illness Jane Armstrong is a 53 year old female with hypertension and prediabetes who presents for medical weight management.  She has gained four pounds since her last visit. Despite adhering to a 1500 calorie nutrition plan 80% of the time, consuming more home-cooked meals, ensuring adequate protein intake, and maintaining hydration, she experiences inadequate sleep. She exercises three days a week for 60 to 90 minutes.  She has a history of hypertension and prediabetes. Previously, she was on Wegovy , but due to lack of coverage, she switched to Qsymia , currently taking 11.25 mg once a week since August 2025. Initially, the medication helped with appetite control, but she feels she is getting used to it now.  Her highest recorded weight loss was 37 pounds, but she experienced some weight regain after stopping Wegovy  in August and September. She is uncertain about the weight gain, noting that her clothes fit differently and she feels lighter.     Challenges affecting patient progress: metabolic  adaptations associated with weight loss.    Pharmacotherapy for weight management: She is currently taking Qsymia  with adequate clinical response  and without side effects..   Assessment and Plan   Treatment Plan For Obesity:  Recommended Dietary Goals  Jane Armstrong is currently in the action stage of change. As such, her goal is to continue weight management plan. She has agreed to: continue current plan  Behavioral Health and Counseling  We discussed the following behavioral modification strategies today: continue to work on maintaining a reduced calorie state, getting the recommended amount of protein, incorporating whole foods, making healthy choices, staying well hydrated and practicing mindfulness when eating. and increase protein intake, fibrous foods (25 grams per day for women, 30 grams for men) and water to improve satiety and decrease hunger signals. .  Additional education and resources provided today: None  Recommended Physical Activity Goals  Jane Armstrong has been advised to work up to 150 minutes of moderate intensity aerobic activity a week and strengthening exercises 2-3 times per week for cardiovascular health, weight loss maintenance and preservation of muscle mass.  She has agreed to :  Increase volume of physical activity to a goal of 240 minutes a week and Combine aerobic and strengthening exercises for efficiency and improved cardiometabolic health.  Medical Interventions and Pharmacotherapy  We discussed various medication options to help Jane Armstrong with her weight loss efforts and we both agreed to : Increase increase Qsymia  to 15 mg / 92 mgwhich is the maximum recommended dose if less than 5% weight loss medication will be discontinued  Associated Conditions  Impacted by Obesity Treatment  Assessment & Plan Class 2 severe obesity with serious comorbidity and body mass index (BMI) of 35.0 to 35.9 in adult, unspecified obesity type Weight: decrease of 41 lb (15.5%) over 1  year, 2 months  Start: 12/10/2022 264 lb (119.7 kg)  End: 02/13/2024 223 lb (101.2 kg)  With 9 pound weight regain, and off Wegovy . Bioimpedance information reviewed unreliable results is not consistent with previous trends. Check indirect calorimetry at the next office visit to assess for adaptive thermogenesis Continue current reduced calorie nutrition plan of 1500 cal Increase volume of physical activity to 240 minutes a week Increase Qsymia  to 15 mg / 92 mg once a day.  Blood pressure and heart rate are well-controlled.  She is low risk for pregnancy.  No signs of developing tolerance. PDMP reviewed during this encounter.  Abnormal food appetite She has increased orexigenic signaling, impaired satiety and inhibitory control. This is secondary to an abnormal energy regulation system and pathological neurohormonal pathways characteristic of excess adiposity.  In addition to nutritional and behavioral strategies she benefits from pharmacotherapy.  She has been on Wegovy  in the past but no longer has coverage.  She is now on Qsymia .  Medication will be increased for better orixegenic control.  Prediabetes Improved.  Previously on Wegovy  for pharmacoprophylaxis.  Consider adding metformin in the future.         Objective   Physical Exam:  Blood pressure 114/80, pulse 68, temperature 98.2 F (36.8 C), height 5' 8 (1.727 m), weight 232 lb (105.2 kg), SpO2 96%. Body mass index is 35.28 kg/m.  General: She is overweight, cooperative, alert, well developed, and in no acute distress. PSYCH: Has normal mood, affect and thought process.   HEENT: EOMI, sclerae are anicteric. Lungs: Normal breathing effort, no conversational dyspnea. Extremities: No edema.  Neurologic: No gross sensory or motor deficits. No tremors or fasciculations noted.    Diagnostic Data Reviewed:  BMET    Component Value Date/Time   NA 140 02/05/2024 0920   K 3.3 (L) 02/05/2024 0920   CL 99 02/05/2024 0920   CO2  24 02/05/2024 0920   GLUCOSE 71 02/05/2024 0920   BUN 12 02/05/2024 0920   CREATININE 0.80 02/05/2024 0920   CALCIUM 9.5 02/05/2024 0920   GFRNONAA 82 11/10/2020 1045   GFRAA 94 11/10/2020 1045   Lab Results  Component Value Date   HGBA1C 5.3 02/05/2024   HGBA1C 5.8 04/18/2018   Lab Results  Component Value Date   INSULIN  22.4 12/26/2022   INSULIN  5.6 07/12/2020   Lab Results  Component Value Date   TSH 1.110 12/26/2022   CBC    Component Value Date/Time   WBC 5.8 02/05/2024 0920   RBC 5.10 02/05/2024 0920   HGB 14.2 02/05/2024 0920   HCT 43.7 02/05/2024 0920   PLT 288 02/05/2024 0920   MCV 86 02/05/2024 0920   MCH 27.8 02/05/2024 0920   MCHC 32.5 02/05/2024 0920   RDW 13.0 02/05/2024 0920   Iron Studies No results found for: IRON, TIBC, FERRITIN, IRONPCTSAT Lipid Panel     Component Value Date/Time   CHOL 128 02/05/2024 0920   TRIG 56 02/05/2024 0920   HDL 56 02/05/2024 0920   CHOLHDL 2.3 02/05/2024 0920   LDLCALC 60 02/05/2024 0920   Hepatic Function Panel     Component Value Date/Time   PROT 7.6 02/05/2024 0920   ALBUMIN 4.5 02/05/2024 0920   AST 18 02/05/2024 0920   ALT 20 02/05/2024  0920   ALKPHOS 88 02/05/2024 0920   BILITOT 0.4 02/05/2024 0920      Component Value Date/Time   TSH 1.110 12/26/2022 0927   Nutritional Lab Results  Component Value Date   VD25OH 40.4 02/05/2024   VD25OH 45.4 07/25/2023   VD25OH 44.1 12/10/2022    Medications: Outpatient Encounter Medications as of 06/29/2024  Medication Sig   cetirizine  (ZYRTEC ) 10 MG tablet Take 1 tablet (10 mg total) by mouth daily.   EPINEPHrine  0.3 mg/0.3 mL IJ SOAJ injection Inject 0.3 mg into the muscle as needed for anaphylaxis. Use as directed   ergocalciferol  (VITAMIN D2) 1.25 MG (50000 UT) capsule Take 1 capsule (50,000 Units total) by mouth once a week.   hydrochlorothiazide  (HYDRODIURIL ) 25 MG tablet TAKE 1 TABLET(25 MG) BY MOUTH DAILY   ibuprofen  (ADVIL ) 800 MG tablet  TAKE 1 TABLET BY MOUTH 3 TIMES A DAY WITH FOOD AS NEEDED   Phentermine -Topiramate  ER 15-92 MG CP24 Take 1 capsule by mouth daily.   polyethylene glycol-electrolytes (NULYTELY) 420 g solution Use as directed   telmisartan  (MICARDIS ) 40 MG tablet Take 1 tablet (40 mg total) by mouth daily.   [DISCONTINUED] Phentermine -Topiramate  ER 11.25-69 MG CP24 Take 1 capsule by mouth daily.   No facility-administered encounter medications on file as of 06/29/2024.     Follow-Up   Return in about 4 weeks (around 07/27/2024) for Fasting and 30 minutes early for IC.SABRA She was informed of the importance of frequent follow up visits to maximize her success with intensive lifestyle modifications for her multiple health conditions.  Attestation Statement   Reviewed by clinician on day of visit: allergies, medications, problem list, medical history, surgical history, family history, social history, and previous encounter notes.     [name], MD

## 2024-06-29 NOTE — Assessment & Plan Note (Signed)
 She has increased orexigenic signaling, impaired satiety and inhibitory control. This is secondary to an abnormal energy regulation system and pathological neurohormonal pathways characteristic of excess adiposity.  In addition to nutritional and behavioral strategies she benefits from pharmacotherapy.  She has been on Wegovy  in the past but no longer has coverage.  She is now on Qsymia .  Medication will be increased for better orixegenic control.

## 2024-07-15 ENCOUNTER — Other Ambulatory Visit: Payer: Self-pay | Admitting: Nurse Practitioner

## 2024-07-15 DIAGNOSIS — I10 Essential (primary) hypertension: Secondary | ICD-10-CM

## 2024-07-29 ENCOUNTER — Ambulatory Visit (INDEPENDENT_AMBULATORY_CARE_PROVIDER_SITE_OTHER): Admitting: Internal Medicine

## 2024-07-29 ENCOUNTER — Other Ambulatory Visit (HOSPITAL_COMMUNITY): Payer: Self-pay

## 2024-07-29 VITALS — BP 104/70 | HR 94 | Temp 98.4°F | Ht 68.0 in | Wt 229.0 lb

## 2024-07-29 DIAGNOSIS — E66812 Obesity, class 2: Secondary | ICD-10-CM

## 2024-07-29 DIAGNOSIS — R638 Other symptoms and signs concerning food and fluid intake: Secondary | ICD-10-CM

## 2024-07-29 DIAGNOSIS — Z6835 Body mass index (BMI) 35.0-35.9, adult: Secondary | ICD-10-CM

## 2024-07-29 DIAGNOSIS — R948 Abnormal results of function studies of other organs and systems: Secondary | ICD-10-CM | POA: Insufficient documentation

## 2024-07-29 DIAGNOSIS — R0602 Shortness of breath: Secondary | ICD-10-CM | POA: Diagnosis not present

## 2024-07-29 DIAGNOSIS — R7303 Prediabetes: Secondary | ICD-10-CM

## 2024-07-29 MED ORDER — PHENTERMINE-TOPIRAMATE ER 15-92 MG PO CP24
1.0000 | ORAL_CAPSULE | Freq: Every day | ORAL | 0 refills | Status: DC
  Filled 2024-07-29: qty 30, 30d supply, fill #0

## 2024-07-29 NOTE — Assessment & Plan Note (Signed)
 Improved.  Previously on Wegovy  for pharmacoprophylaxis.  No longer covered.  She is now on Qsymia  for weight management.  Consider adding metformin for possible entertained generic liraglutide  in January if not cost prohibitive.

## 2024-07-29 NOTE — Progress Notes (Signed)
 Office: [phone]  /  Fax: [phone]  Weight Summary and Body Composition Analysis (BIA)  Vitals Temp: 98.4 F (36.9 C) BP: 104/70 Pulse Rate: 94 SpO2: 96 %   Anthropometric Measurements Height: 5' 8 (1.727 m) Weight: 229 lb (103.9 kg) BMI (Calculated): 34.83 Weight at Last Visit: 232 lb Weight Lost Since Last Visit: 3 lb Weight Gained Since Last Visit: 0 lb Starting Weight: 260 lb Total Weight Loss (lbs): 31 lb (14.1 kg) Peak Weight: 296 lb   Body Composition  Body Fat %: 40 % Fat Mass (lbs): 91.6 lbs Muscle Mass (lbs): 130.6 lbs Total Body Water (lbs): 84.8 lbs Visceral Fat Rating : 11    RMR: 1469  Today's Visit #: 18  Starting Date: 01/06/23   Subjective   Chief Complaint: Obesity  Interval History Discussed the use of AI scribe software for clinical note transcription with the patient, who gave verbal consent to proceed.  History of Present Illness Jane Armstrong is a 53 year old female who presents for follow-up on weight management.  She has experienced a weight loss of three pounds since her last visit, attributing this progress to the effectiveness of her current medication, which has reduced her 'food noise'. Despite dealing with her mother's health issues, she has managed to maintain her weight loss efforts.  She was previously on Wegovy  but switched to the generic form of Qsymia  due to insurance coverage issues. She experiences dry mouth as a side effect but reports no changes in blood pressure. Her current physical activity includes 90 minutes of exercise three days a week.  Her metabolic rate has decreased from 1900 to 1500 calories. She is considering using tracking apps to monitor her intake and has previously used 'Lose It'.  She reports experiencing dry mouth.     Challenges affecting patient progress: metabolic adaptations associated with weight loss.    Pharmacotherapy for weight management: She is currently taking  Qsymia  with adequate clinical response  and without side effects. and no longer has coverage for GLP-1.   Assessment and Plan   Treatment Plan For Obesity:  Recommended Dietary Goals  Jane Armstrong is currently in the action stage of change. As such, her goal is to continue weight management plan. She has agreed to: Switch to 1200 cal/day  Behavioral Health and Counseling  We discussed the following behavioral modification strategies today: continue to work on maintaining a reduced calorie state, getting the recommended amount of protein, incorporating whole foods, making healthy choices, staying well hydrated and practicing mindfulness when eating. and increase protein intake, fibrous foods (25 grams per day for women, 30 grams for men) and water to improve satiety and decrease hunger signals. .  Additional education and resources provided today: Handout on traveling and holiday eating strategies  Recommended Physical Activity Goals  Jane Armstrong has been advised to work up to 150 minutes of moderate intensity aerobic activity a week and strengthening exercises 2-3 times per week for cardiovascular health, weight loss maintenance and preservation of muscle mass.  She has agreed to :  Discussed changing exercise routine to avoid adaptation plateau or training plateau, Increase and monitor steps for a goal of 10,000 per day, Increase volume of physical activity to a goal of 240 minutes a week, and Combine aerobic and strengthening exercises for efficiency and improved cardiometabolic health.  Medical Interventions and Pharmacotherapy  We discussed various medication options to help Jane Armstrong with her weight loss efforts and we both agreed to : Does not have coverage for  GLP-1, Adequate clinical response to anti-obesity medication, continue current regimen, and Patient was counseled on the importance of maintaining healthy lifestyle habits, including balanced nutrition, regular physical activity, and  behavioral modifications, while taking antiobesity medication.  Patient verbalized understanding that medication is an adjunct to, not a replacement for, lifestyle changes and that the long-term success and weight maintenance depend on continued adherence to these strategies.  We reviewed indications, medication side effects including risk of teratogenicity.  She is postmenopausal We reviewed [place] Controlled Substance Database, patient not on controlled substances.   Associated Conditions Impacted by Obesity Treatment  Assessment & Plan SOB (shortness of breath) on exertion Indirect calorimetry showed a resting energy expenditure of 1465 this is lower than her basal metabolic rate of 8107 which is indicative of a metabolic slowdown likely due to adaptations from significant weight loss. Class 2 severe obesity with serious comorbidity and body mass index (BMI) of 35.0 to 35.9 in adult, unspecified obesity type  Weight: decrease of 35 lb (13.3%) over 1 year, 7 months  Start: 12/10/2022 264 lb (119.7 kg)  End: 07/29/2024 229 lb (103.9 kg)   Class 2 obesity with recent weight loss of three pounds. Current medication, generic Qsymia , is effective with no significant side effects. Metabolic rate decreased from 1900 to 1500 calories per day, contributing to slowed weight loss. Physical activity maintained at 90 minutes, three days a week. - Adjusted daily caloric intake to 1200 calories, with a target of staying under 1500 calories to ensure weight loss. - Prioritize protein intake followed by vegetables and then carbohydrates. - Consider using AI tools or apps like Lose It for meal planning and tracking. - Continue physical activity at current level. - Provided handout on maintaining weight during holidays. - Sent prescription for Qsymia  to pharmacy. - Scheduled follow-up appointment in four weeks. Abnormal food appetite She has increased orexigenic signaling, impaired satiety and inhibitory control.  This is secondary to an abnormal energy regulation system and pathological neurohormonal pathways characteristic of excess adiposity.  In addition to nutritional and behavioral strategies she benefits from pharmacotherapy.  She has been on Wegovy  in the past but no longer has coverage.  She is now on Qsymia  with improved satiety and satiation.  No side effects reported continue current dose  Prediabetes Improved.  Previously on Wegovy  for pharmacoprophylaxis.  No longer covered.  She is now on Qsymia  for weight management.  Consider adding metformin for possible entertained generic liraglutide  in January if not cost prohibitive. Abnormal metabolism Patient has a slower than predicted metabolism. IC 1465 vs. calculated 1882. This may contribute to weight gain, chronic fatigue and difficulty losing weight.   We reviewed measures to improve metabolism including not skipping meals, progressive strengthening exercises, increasing protein intake at every meal and maintaining adequate hydration and sleep.           Objective   Physical Exam:  Blood pressure 104/70, pulse 94, temperature 98.4 F (36.9 C), height 5' 8 (1.727 m), weight 229 lb (103.9 kg), SpO2 96%. Body mass index is 34.82 kg/m.  General: She is overweight, cooperative, alert, well developed, and in no acute distress. PSYCH: Has normal mood, affect and thought process.   HEENT: EOMI, sclerae are anicteric. Lungs: Normal breathing effort, no conversational dyspnea. Extremities: No edema.  Neurologic: No gross sensory or motor deficits. No tremors or fasciculations noted.    Diagnostic Data Reviewed:  BMET    Component Value Date/Time   NA 140 02/05/2024 0920   K 3.3 (  L) 02/05/2024 0920   CL 99 02/05/2024 0920   CO2 24 02/05/2024 0920   GLUCOSE 71 02/05/2024 0920   BUN 12 02/05/2024 0920   CREATININE 0.80 02/05/2024 0920   CALCIUM 9.5 02/05/2024 0920   GFRNONAA 82 11/10/2020 1045   GFRAA 94 11/10/2020 1045   Lab  Results  Component Value Date   HGBA1C 5.3 02/05/2024   HGBA1C 5.8 04/18/2018   Lab Results  Component Value Date   INSULIN  22.4 12/26/2022   INSULIN  5.6 07/12/2020   Lab Results  Component Value Date   TSH 1.110 12/26/2022   CBC    Component Value Date/Time   WBC 5.8 02/05/2024 0920   RBC 5.10 02/05/2024 0920   HGB 14.2 02/05/2024 0920   HCT 43.7 02/05/2024 0920   PLT 288 02/05/2024 0920   MCV 86 02/05/2024 0920   MCH 27.8 02/05/2024 0920   MCHC 32.5 02/05/2024 0920   RDW 13.0 02/05/2024 0920   Iron Studies No results found for: IRON, TIBC, FERRITIN, IRONPCTSAT Lipid Panel     Component Value Date/Time   CHOL 128 02/05/2024 0920   TRIG 56 02/05/2024 0920   HDL 56 02/05/2024 0920   CHOLHDL 2.3 02/05/2024 0920   LDLCALC 60 02/05/2024 0920   Hepatic Function Panel     Component Value Date/Time   PROT 7.6 02/05/2024 0920   ALBUMIN 4.5 02/05/2024 0920   AST 18 02/05/2024 0920   ALT 20 02/05/2024 0920   ALKPHOS 88 02/05/2024 0920   BILITOT 0.4 02/05/2024 0920      Component Value Date/Time   TSH 1.110 12/26/2022 0927   Nutritional Lab Results  Component Value Date   VD25OH 40.4 02/05/2024   VD25OH 45.4 07/25/2023   VD25OH 44.1 12/10/2022    Medications: Outpatient Encounter Medications as of 07/29/2024  Medication Sig   cetirizine  (ZYRTEC ) 10 MG tablet Take 1 tablet (10 mg total) by mouth daily.   EPINEPHrine  0.3 mg/0.3 mL IJ SOAJ injection Inject 0.3 mg into the muscle as needed for anaphylaxis. Use as directed   ergocalciferol  (VITAMIN D2) 1.25 MG (50000 UT) capsule Take 1 capsule (50,000 Units total) by mouth once a week.   hydrochlorothiazide  (HYDRODIURIL ) 25 MG tablet TAKE 1 TABLET(25 MG) BY MOUTH DAILY   ibuprofen  (ADVIL ) 800 MG tablet TAKE 1 TABLET BY MOUTH 3 TIMES A DAY WITH FOOD AS NEEDED   Phentermine -Topiramate  ER 15-92 MG CP24 Take 1 capsule by mouth daily.   polyethylene glycol-electrolytes (NULYTELY) 420 g solution Use as directed    telmisartan  (MICARDIS ) 40 MG tablet TAKE 1 TABLET(40 MG) BY MOUTH DAILY   [DISCONTINUED] Phentermine -Topiramate  ER 15-92 MG CP24 Take 1 capsule by mouth daily.   No facility-administered encounter medications on file as of 07/29/2024.     Follow-Up   Return in about 4 weeks (around 08/26/2024) for For Weight Mangement with Dr. Francyne.SABRA She was informed of the importance of frequent follow up visits to maximize her success with intensive lifestyle modifications for her multiple health conditions.  Attestation Statement   Reviewed by clinician on day of visit: allergies, medications, problem list, medical history, surgical history, family history, social history, and previous encounter notes.     Lucas Francyne, MD

## 2024-07-29 NOTE — Assessment & Plan Note (Signed)
 She has increased orexigenic signaling, impaired satiety and inhibitory control. This is secondary to an abnormal energy regulation system and pathological neurohormonal pathways characteristic of excess adiposity.  In addition to nutritional and behavioral strategies she benefits from pharmacotherapy.  She has been on Wegovy  in the past but no longer has coverage.  She is now on Qsymia  with improved satiety and satiation.  No side effects reported continue current dose

## 2024-07-29 NOTE — Assessment & Plan Note (Signed)
  Weight: decrease of 35 lb (13.3%) over 1 year, 7 months  Start: 12/10/2022 264 lb (119.7 kg)  End: 07/29/2024 229 lb (103.9 kg)   Class 2 obesity with recent weight loss of three pounds. Current medication, generic Qsymia , is effective with no significant side effects. Metabolic rate decreased from 1900 to 1500 calories per day, contributing to slowed weight loss. Physical activity maintained at 90 minutes, three days a week. - Adjusted daily caloric intake to 1200 calories, with a target of staying under 1500 calories to ensure weight loss. - Prioritize protein intake followed by vegetables and then carbohydrates. - Consider using AI tools or apps like Lose It for meal planning and tracking. - Continue physical activity at current level. - Provided handout on maintaining weight during holidays. - Sent prescription for Qsymia  to pharmacy. - Scheduled follow-up appointment in four weeks.

## 2024-07-29 NOTE — Assessment & Plan Note (Signed)
 Patient has a slower than predicted metabolism. IC 1465 vs. calculated 1882. This may contribute to weight gain, chronic fatigue and difficulty losing weight.   We reviewed measures to improve metabolism including not skipping meals, progressive strengthening exercises, increasing protein intake at every meal and maintaining adequate hydration and sleep.

## 2024-07-30 ENCOUNTER — Other Ambulatory Visit (HOSPITAL_COMMUNITY): Payer: Self-pay

## 2024-07-30 ENCOUNTER — Ambulatory Visit (INDEPENDENT_AMBULATORY_CARE_PROVIDER_SITE_OTHER): Payer: Self-pay | Admitting: Internal Medicine

## 2024-07-30 ENCOUNTER — Encounter: Payer: Self-pay | Admitting: Nurse Practitioner

## 2024-08-06 ENCOUNTER — Other Ambulatory Visit (HOSPITAL_COMMUNITY): Payer: Self-pay

## 2024-08-10 ENCOUNTER — Ambulatory Visit: Payer: Self-pay | Admitting: Nurse Practitioner

## 2024-08-17 ENCOUNTER — Other Ambulatory Visit (HOSPITAL_COMMUNITY): Payer: Self-pay

## 2024-08-17 ENCOUNTER — Ambulatory Visit: Admitting: Nurse Practitioner

## 2024-08-17 ENCOUNTER — Other Ambulatory Visit: Payer: Self-pay | Admitting: Nurse Practitioner

## 2024-08-17 VITALS — BP 130/70 | HR 88 | Temp 97.4°F | Ht 68.0 in | Wt 237.2 lb

## 2024-08-17 DIAGNOSIS — Z23 Encounter for immunization: Secondary | ICD-10-CM

## 2024-08-17 DIAGNOSIS — Z139 Encounter for screening, unspecified: Secondary | ICD-10-CM | POA: Diagnosis not present

## 2024-08-17 DIAGNOSIS — Z79899 Other long term (current) drug therapy: Secondary | ICD-10-CM

## 2024-08-17 DIAGNOSIS — I10 Essential (primary) hypertension: Secondary | ICD-10-CM | POA: Diagnosis not present

## 2024-08-17 DIAGNOSIS — E66812 Obesity, class 2: Secondary | ICD-10-CM

## 2024-08-17 DIAGNOSIS — E559 Vitamin D deficiency, unspecified: Secondary | ICD-10-CM

## 2024-08-17 DIAGNOSIS — R7303 Prediabetes: Secondary | ICD-10-CM

## 2024-08-17 DIAGNOSIS — Z6836 Body mass index (BMI) 36.0-36.9, adult: Secondary | ICD-10-CM

## 2024-08-17 MED ORDER — ERGOCALCIFEROL 1.25 MG (50000 UT) PO CAPS
50000.0000 [IU] | ORAL_CAPSULE | ORAL | 5 refills | Status: AC
  Filled 2024-08-17: qty 4, 28d supply, fill #0
  Filled 2024-09-14: qty 4, 28d supply, fill #1
  Filled 2024-10-02 – 2024-10-06 (×2): qty 4, 28d supply, fill #2

## 2024-08-17 MED ORDER — TELMISARTAN 40 MG PO TABS
40.0000 mg | ORAL_TABLET | Freq: Every day | ORAL | 1 refills | Status: DC
  Filled 2024-08-17 – 2024-09-03 (×2): qty 90, 90d supply, fill #0

## 2024-08-17 MED ORDER — HYDROCHLOROTHIAZIDE 25 MG PO TABS
25.0000 mg | ORAL_TABLET | Freq: Every day | ORAL | 1 refills | Status: DC
  Filled 2024-08-17 – 2024-09-03 (×2): qty 90, 90d supply, fill #0

## 2024-08-17 MED ORDER — CETIRIZINE HCL 10 MG PO TABS
10.0000 mg | ORAL_TABLET | Freq: Every day | ORAL | 1 refills | Status: AC
  Filled 2024-08-17: qty 90, 90d supply, fill #0
  Filled 2024-10-06: qty 90, 90d supply, fill #1

## 2024-08-17 NOTE — Progress Notes (Signed)
 LILLETTE Kristeen JINNY Gladis, CMA,acting as a neurosurgeon for Gaines Ada, FNP.,have documented all relevant documentation on the behalf of Gaines Ada, FNP,as directed by  Gaines Ada, FNP while in the presence of Gaines Ada, FNP.  Subjective:  Patient ID: Jane Armstrong , female    DOB: 1971/03/31 , 53 y.o.   MRN: [id number]  Chief Complaint  Patient presents with   Hypertension    Patient presents today for a bp and dm follow up, Patient reports compliance with medication. Patient denies any chest pain, SOB, or headaches.    Dizziness    Patient reports having dizziness when laying down about 3 days ago when laying down. She reports she increased her water and the dizziness has subsided.      HPI  Discussed the use of AI scribe software for clinical note transcription with the patient, who gave verbal consent to proceed.  History of Present Illness Jane Armstrong is a 53 year old female who presents with vertigo and concerns about dehydration.  She experiences vertigo, particularly when lying down to sleep, with a sensation of spinning. This began last Thursday, and she initially suspected dehydration as the cause. Increasing her water intake seemed to alleviate the symptoms.  She is currently taking Qsymia  for weight management and [facility]. She notes a discrepancy between her weight measurements at home and at the clinic, attributing it to the time of day and her water intake. She continues to exercise regularly and has not missed any doses of her medication recently.  She has switched her pharmacy to [facility] but has not yet filled her prescriptions there. She has a couple of refills remaining and does not need them immediately.  Past Medical History:  Diagnosis Date   COVID-19 vaccine administered 10/10/2023   Hypertension    Iron deficiency anemia 12/10/2022   Obesity      Family History  Problem Relation Age of Onset   Hypertension  Mother    Diabetes Mother    Breast cancer Mother 8   Heart disease Mother    Kidney disease Mother    Sleep apnea Mother    Obesity Mother      Current Outpatient Medications:    EPINEPHrine  0.3 mg/0.3 mL IJ SOAJ injection, Inject 0.3 mg into the muscle as needed for anaphylaxis. Use as directed, Disp: 1 each, Rfl: 3   ibuprofen  (ADVIL ) 800 MG tablet, TAKE 1 TABLET BY MOUTH 3 TIMES A DAY WITH FOOD AS NEEDED, Disp: 90 tablet, Rfl: 0   Phentermine -Topiramate  ER 15-92 MG CP24, Take 1 capsule by mouth daily., Disp: 30 capsule, Rfl: 0   cetirizine  (ZYRTEC ) 10 MG tablet, Take 1 tablet (10 mg total) by mouth daily., Disp: 90 tablet, Rfl: 1   ergocalciferol  (VITAMIN D2) 1.25 MG (50000 UT) capsule, Take 1 capsule (50,000 Units total) by mouth once a week., Disp: 4 capsule, Rfl: 5   hydrochlorothiazide  (HYDRODIURIL ) 25 MG tablet, Take 1 tablet (25 mg total) by mouth daily., Disp: 90 tablet, Rfl: 1   telmisartan  (MICARDIS ) 40 MG tablet, Take 1 tablet (40 mg total) by mouth daily., Disp: 90 tablet, Rfl: 1   Allergies  Allergen Reactions   Bee Venom Shortness Of Breath   Clindamycin/Lincomycin Hives   Latex Itching and Rash     Review of Systems  Constitutional: Negative.   Respiratory: Negative.    Cardiovascular: Negative.  Negative for chest pain, palpitations and leg swelling.  Gastrointestinal: Negative.   Neurological: Negative.  Psychiatric/Behavioral: Negative.       Today's Vitals   08/17/24 1600  BP: 130/70  Pulse: 88  Temp: (!) 97.4 F (36.3 C)  TempSrc: Oral  Weight: 237 lb 3.2 oz (107.6 kg)  Height: 5' 8 (1.727 m)  PainSc: 0-No pain   Body mass index is 36.07 kg/m.  Wt Readings from Last 3 Encounters:  08/17/24 237 lb 3.2 oz (107.6 kg)  07/29/24 229 lb (103.9 kg)  06/29/24 232 lb (105.2 kg)     Objective:  Physical Exam Vitals and nursing note reviewed.  Constitutional:      General: She is not in acute distress.    Appearance: Normal appearance. She is  obese.  Cardiovascular:     Rate and Rhythm: Normal rate and regular rhythm.     Pulses: Normal pulses.     Heart sounds: Normal heart sounds. No murmur heard. Pulmonary:     Effort: Pulmonary effort is normal. No respiratory distress.     Breath sounds: Normal breath sounds. No wheezing.  Skin:    General: Skin is warm and dry.  Neurological:     General: No focal deficit present.     Mental Status: She is alert and oriented to person, place, and time.     Cranial Nerves: No cranial nerve deficit.     Motor: No weakness.  Psychiatric:        Mood and Affect: Mood normal.        Behavior: Behavior normal.        Thought Content: Thought content normal.        Judgment: Judgment normal.         Assessment And Plan:   Assessment & Plan Essential hypertension Blood pressure controlled at 130/70 mmHg. - Continue current antihypertensive regimen. Prediabetes  Need for influenza vaccination Influenza vaccine administered Encouraged to take Tylenol as needed for fever or muscle aches. Encounter for screening  Vitamin D  deficiency Vitamin D  levels to be checked routinely. - Checked vitamin D  levels. Class 2 severe obesity due to excess calories with serious comorbidity and body mass index (BMI) of 36.0 to 36.9 in adult Attending [facility] and on Qsymia . Good adherence to regimen. Weight discrepancies likely due to timing of measurements. - Continue Qsymia . - Continue [facility] program. Other long term (current) drug therapy   Orders Placed This Encounter  Procedures   Flu vaccine trivalent PF, 6mos and older(Flulaval,Afluria,Fluarix,Fluzone)   VITAMIN D  25 Hydroxy (Vit-D Deficiency, Fractures)   CBC   BMP8+eGFR   Hemoglobin A1c   Hepatitis B Surface Antibody    Return for keep same next.  Patient was given opportunity to ask questions. Patient verbalized understanding of the plan and was able to repeat key elements of the  plan. All questions were answered to their satisfaction.    LILLETTE Gaines Ada, FNP, have reviewed all documentation for this visit. The documentation on 08/17/24 for the exam, diagnosis, procedures, and orders are all accurate and complete.   IF YOU HAVE BEEN REFERRED TO A SPECIALIST, IT MAY TAKE 1-2 WEEKS TO SCHEDULE/PROCESS THE REFERRAL. IF YOU HAVE NOT HEARD FROM US /SPECIALIST IN TWO WEEKS, PLEASE GIVE US  A CALL AT [phone].

## 2024-08-17 NOTE — Assessment & Plan Note (Signed)
 Blood pressure controlled at 130/70 mmHg. - Continue current antihypertensive regimen.

## 2024-08-17 NOTE — Assessment & Plan Note (Signed)
 Vitamin D  levels to be checked routinely. - Checked vitamin D  levels.

## 2024-08-17 NOTE — Assessment & Plan Note (Signed)
 Attending [facility] and on Qsymia . Good adherence to regimen. Weight discrepancies likely due to timing of measurements. - Continue Qsymia . - Continue [facility] program.

## 2024-08-17 NOTE — Assessment & Plan Note (Signed)
 Stable, continue current medications.

## 2024-08-18 ENCOUNTER — Ambulatory Visit: Payer: Self-pay | Admitting: Nurse Practitioner

## 2024-08-18 ENCOUNTER — Encounter: Payer: Self-pay | Admitting: Nurse Practitioner

## 2024-08-18 LAB — CBC
Hematocrit: 43.1 % (ref 34.0–46.6)
Hemoglobin: 14.3 g/dL (ref 11.1–15.9)
MCH: 28.4 pg (ref 26.6–33.0)
MCHC: 33.2 g/dL (ref 31.5–35.7)
MCV: 86 fL (ref 79–97)
Platelets: 279 x10E3/uL (ref 150–450)
RBC: 5.03 x10E6/uL (ref 3.77–5.28)
RDW: 13 % (ref 11.7–15.4)
WBC: 8.2 x10E3/uL (ref 3.4–10.8)

## 2024-08-18 LAB — BMP8+EGFR
BUN/Creatinine Ratio: 13 (ref 9–23)
BUN: 12 mg/dL (ref 6–24)
CO2: 21 mmol/L (ref 20–29)
Calcium: 9.7 mg/dL (ref 8.7–10.2)
Chloride: 103 mmol/L (ref 96–106)
Creatinine, Ser: 0.94 mg/dL (ref 0.57–1.00)
Glucose: 84 mg/dL (ref 70–99)
Potassium: 4 mmol/L (ref 3.5–5.2)
Sodium: 138 mmol/L (ref 134–144)
eGFR: 73 mL/min/1.73 (ref >59)

## 2024-08-18 LAB — HEPATITIS B SURFACE ANTIBODY,QUALITATIVE

## 2024-08-18 LAB — HEMOGLOBIN A1C
Est. average glucose Bld gHb Est-mCnc: 117 mg/dL
Hgb A1c MFr Bld: 5.7 % — ABNORMAL HIGH (ref 4.8–5.6)

## 2024-08-18 LAB — SPECIMEN STATUS REPORT

## 2024-08-18 LAB — VITAMIN D 25 HYDROXY (VIT D DEFICIENCY, FRACTURES): Vit D, 25-Hydroxy: 26.8 ng/mL — ABNORMAL LOW (ref 30.0–100.0)

## 2024-08-25 ENCOUNTER — Ambulatory Visit: Admitting: Nurse Practitioner

## 2024-08-26 ENCOUNTER — Encounter (INDEPENDENT_AMBULATORY_CARE_PROVIDER_SITE_OTHER): Payer: Self-pay | Admitting: Internal Medicine

## 2024-08-26 ENCOUNTER — Ambulatory Visit (INDEPENDENT_AMBULATORY_CARE_PROVIDER_SITE_OTHER): Admitting: Internal Medicine

## 2024-08-26 VITALS — BP 113/79 | HR 86 | Temp 97.6°F | Ht 68.0 in | Wt 230.0 lb

## 2024-08-26 DIAGNOSIS — E66812 Obesity, class 2: Secondary | ICD-10-CM

## 2024-08-26 DIAGNOSIS — Z6835 Body mass index (BMI) 35.0-35.9, adult: Secondary | ICD-10-CM | POA: Diagnosis not present

## 2024-08-26 DIAGNOSIS — R7303 Prediabetes: Secondary | ICD-10-CM

## 2024-08-26 DIAGNOSIS — I10 Essential (primary) hypertension: Secondary | ICD-10-CM

## 2024-08-26 MED ORDER — LIRAGLUTIDE 18 MG/3ML ~~LOC~~ SOPN: PEN_INJECTOR | SUBCUTANEOUS | 0 refills | Status: DC

## 2024-08-26 MED ORDER — BD PEN NEEDLE MINI U/F 31G X 5 MM MISC: 1.0000 | Freq: Every day | 1 refills | Status: AC

## 2024-08-26 NOTE — Assessment & Plan Note (Signed)
 Improved.  Previously on Wegovy  for pharmacoprophylaxis.  No longer covered.  She is now on Qsymia  for weight management but would like to come off medication.  She will be started on liraglutide 

## 2024-08-26 NOTE — Assessment & Plan Note (Signed)
 Weight: decrease of 34 lb (12.9%) over 1 year, 8 months  Start: 12/10/2022 264 lb (119.7 kg)  End: 08/26/2024 230 lb (104.3 kg)  With some weight regain following transition from Wegovy  to Qsymia .  Transitioning from Wegovy  to Qsymia  due to lack of coverage. Gained one pound since last visit. Qsymia  provides some appetite control but not as effective as Wegovy . Considering switching to liraglutide  (Victoza ) due to cost and coverage issues. Discussed price reductions for Wegovy  and Zepbound , and potential insurance coverage changes. Emphasized importance of maintaining healthy eating habits and regular exercise. - Wean off Qsymia  by taking it every other day and then stop. - Start liraglutide  (Victoza ) at 0.6 mg daily for 7 days, then increase to 1.2 mg, then 1.8 mg, and maintain at 1.8 mg. - Use GoodRx app for discounted pricing on liraglutide . - Sent prescription for liraglutide  and mini pen needles to [facility] on [address]. - Follow up in 4 weeks to assess response to liraglutide . - Check insurance coverage for Wegovy  and Zepbound  in January.

## 2024-08-26 NOTE — Assessment & Plan Note (Signed)
 Her blood pressure is well-controlled with hydrochlorothiazide  and telmisartan  within the target range.   She has not experienced symptoms of hypotension such as lightheadedness or dizziness.  She is no longer on phentermine 

## 2024-08-26 NOTE — Progress Notes (Signed)
 Office: [phone]  /  Fax: [phone]  Weight Summary and Body Composition Analysis (BIA)  Vitals Temp: 97.6 F (36.4 C) BP: 113/79 Pulse Rate: 86 SpO2: 99 %   Anthropometric Measurements Height: 5' 8 (1.727 m) Weight: 230 lb (104.3 kg) BMI (Calculated): 34.98 Weight at Last Visit: 229 lb Weight Lost Since Last Visit: 0 lb Weight Gained Since Last Visit: 1 lb Starting Weight: 260 lb Total Weight Loss (lbs): 30 lb (13.6 kg) Peak Weight: 296 lb   Body Composition  Body Fat %: 40.9 % Fat Mass (lbs): 94 lbs Muscle Mass (lbs): 129.2 lbs Total Body Water (lbs): 85.6 lbs Visceral Fat Rating : 11    RMR: 1469  Today's Visit #: 19  Starting Date: 01/06/23   Subjective   Chief Complaint: Obesity  Interval History Discussed the use of AI scribe software for clinical note transcription with the patient, who gave verbal consent to proceed.  History of Present Illness Jane Armstrong is a 53 year old female who presents for medical weight management.  Since last office visit she has gained 1 pound.  She is following a 1200-calorie nutrition plan 50% of the time she is exercising 3 days a week about 120 minutes doing cardio and strengthening.  She is transitioning from Wegovy  to Qsymia  due to lack of insurance coverage. Since her last visit, she has gained one pound. She has about ten Qsymia  pills left and has not been taking them regularly.  She is managing her weight by maintaining a healthy diet and exercising regularly, despite challenges in meal preparation due to her mother's medical appointments. She always cooks healthy meals but sometimes resorts to eating out when time is constrained.  She experienced dizzy spells at night when lying on her side, which she attributes to dehydration. After increasing her fluid intake, the dizziness resolved. She now carries a water bottle to ensure she stays hydrated.  Her recent lab work showed a slight increase in  her A1c level.  She lives with her mother and is preparing to take FMLA leave to care for her after her hip replacement surgery.     Challenges affecting patient progress: Caregiving responsibilities with her mom.    Pharmacotherapy for weight management: She is currently taking Qsymia  with adequate clinical response  and patient has decided to stop medication is costing her approximately $160 a month..   Assessment and Plan   Treatment Plan For Obesity:  Recommended Dietary Goals  Jane Armstrong is currently in the action stage of change. As such, her goal is to continue weight management plan. She has agreed to: continue current plan  Behavioral Health and Counseling  We discussed the following behavioral modification strategies today: continue to work on maintaining a reduced calorie state, getting the recommended amount of protein, incorporating whole foods, making healthy choices, staying well hydrated and practicing mindfulness when eating. and increase protein intake, fibrous foods (25 grams per day for women, 30 grams for men) and water to improve satiety and decrease hunger signals. .  Additional education and resources provided today: None  Recommended Physical Activity Goals  Jane Armstrong has been advised to work up to 150 minutes of moderate intensity aerobic activity a week and strengthening exercises 2-3 times per week for cardiovascular health, weight loss maintenance and preservation of muscle mass.  She has agreed to :  Think about enjoyable ways to increase daily physical activity and overcoming barriers to exercise, Increase physical activity in their day and reduce sedentary time (increase  NEAT)., Increase volume of physical activity to a goal of 240 minutes a week, and Combine aerobic and strengthening exercises for efficiency and improved cardiometabolic health.  Medical Interventions and Pharmacotherapy  We discussed various medication options to help Jane Armstrong with her  weight loss efforts and we both agreed to : Start anti-obesity medication.  In addition to reduced calorie nutrition plan (RCNP), behavioral strategies and physical activity, Jane Armstrong would benefit from pharmacotherapy to assist with hunger signals, satiety and cravings. This will reduce obesity-related health risks by inducing weight loss, and help reduce food consumption and adherence to [facility]) . It may also improve QOL by improving self-confidence and reduce the  setbacks associated with metabolic adaptations.  She had been on Wegovy  with great success but medication is no longer covered through her insurance.  We had transitioned her over to Qsymia  but she does not like how she feels on the medication and also the cost.  After discussion of treatment options, mechanisms of action, benefits, side effects, contraindications and shared decision making she is agreeable to starting liraglutide  1.8 mg daily. Patient also made aware that medication is indicated for long-term management of obesity and the risk of weight regain following discontinuation of treatment and hence the importance of adhering to medical weight loss plan.  We demonstrated use of device and patient using teach back method was able to demonstrate proper technique.  Associated Conditions Impacted by Obesity Treatment  Assessment & Plan Class 2 severe obesity with serious comorbidity and body mass index (BMI) of 35.0 to 35.9 in adult, unspecified obesity type Weight: decrease of 34 lb (12.9%) over 1 year, 8 months  Start: 12/10/2022 264 lb (119.7 kg)  End: 08/26/2024 230 lb (104.3 kg)  With some weight regain following transition from Wegovy  to Qsymia .  Transitioning from Wegovy  to Qsymia  due to lack of coverage. Gained one pound since last visit. Qsymia  provides some appetite control but not as effective as Wegovy . Considering switching to liraglutide  (Victoza ) due to cost and coverage issues. Discussed price reductions for Wegovy  and  Zepbound , and potential insurance coverage changes. Emphasized importance of maintaining healthy eating habits and regular exercise. - Wean off Qsymia  by taking it every other day and then stop. - Start liraglutide  (Victoza ) at 0.6 mg daily for 7 days, then increase to 1.2 mg, then 1.8 mg, and maintain at 1.8 mg. - Use GoodRx app for discounted pricing on liraglutide . - Sent prescription for liraglutide  and mini pen needles to [facility] on [address]. - Follow up in 4 weeks to assess response to liraglutide . - Check insurance coverage for Wegovy  and Zepbound  in January. Prediabetes Improved.  Previously on Wegovy  for pharmacoprophylaxis.  No longer covered.  She is now on Qsymia  for weight management but would like to come off medication.  She will be started on liraglutide  Essential hypertension Her blood pressure is well-controlled with hydrochlorothiazide  and telmisartan  within the target range.   She has not experienced symptoms of hypotension such as lightheadedness or dizziness.  She is no longer on phentermine           Objective   Physical Exam:  Blood pressure 113/79, pulse 86, temperature 97.6 F (36.4 C), height 5' 8 (1.727 m), weight 230 lb (104.3 kg), SpO2 99%. Body mass index is 34.97 kg/m.  General: She is overweight, cooperative, alert, well developed, and in no acute distress. PSYCH: Has normal mood, affect and thought process.   HEENT: EOMI, sclerae are anicteric. Lungs: Normal breathing effort, no conversational dyspnea. Extremities: No  edema.  Neurologic: No gross sensory or motor deficits. No tremors or fasciculations noted.    Diagnostic Data Reviewed:  BMET    Component Value Date/Time   NA 138 08/17/2024 0000   K 4.0 08/17/2024 0000   CL 103 08/17/2024 0000   CO2 21 08/17/2024 0000   GLUCOSE 84 08/17/2024 0000   BUN 12 08/17/2024 0000   CREATININE 0.94 08/17/2024 0000   CALCIUM 9.7 08/17/2024 0000   GFRNONAA 82 11/10/2020 1045   GFRAA 94 11/10/2020  1045   Lab Results  Component Value Date   HGBA1C 5.7 (H) 08/17/2024   HGBA1C 5.8 04/18/2018   Lab Results  Component Value Date   INSULIN  22.4 12/26/2022   INSULIN  5.6 07/12/2020   Lab Results  Component Value Date   TSH 1.110 12/26/2022   CBC    Component Value Date/Time   WBC 8.2 08/17/2024 0000   RBC 5.03 08/17/2024 0000   HGB 14.3 08/17/2024 0000   HCT 43.1 08/17/2024 0000   PLT 279 08/17/2024 0000   MCV 86 08/17/2024 0000   MCH 28.4 08/17/2024 0000   MCHC 33.2 08/17/2024 0000   RDW 13.0 08/17/2024 0000   Iron Studies No results found for: IRON, TIBC, FERRITIN, IRONPCTSAT Lipid Panel     Component Value Date/Time   CHOL 128 02/05/2024 0920   TRIG 56 02/05/2024 0920   HDL 56 02/05/2024 0920   CHOLHDL 2.3 02/05/2024 0920   LDLCALC 60 02/05/2024 0920   Hepatic Function Panel     Component Value Date/Time   PROT 7.6 02/05/2024 0920   ALBUMIN 4.5 02/05/2024 0920   AST 18 02/05/2024 0920   ALT 20 02/05/2024 0920   ALKPHOS 88 02/05/2024 0920   BILITOT 0.4 02/05/2024 0920      Component Value Date/Time   TSH 1.110 12/26/2022 0927   Nutritional Lab Results  Component Value Date   VD25OH 26.8 (L) 08/17/2024   VD25OH 40.4 02/05/2024   VD25OH 45.4 07/25/2023    Medications: Outpatient Encounter Medications as of 08/26/2024  Medication Sig   cetirizine  (ZYRTEC ) 10 MG tablet Take 1 tablet (10 mg total) by mouth daily.   EPINEPHrine  0.3 mg/0.3 mL IJ SOAJ injection Inject 0.3 mg into the muscle as needed for anaphylaxis. Use as directed   ergocalciferol  (VITAMIN D2) 1.25 MG (50000 UT) capsule Take 1 capsule (50,000 Units total) by mouth once a week.   hydrochlorothiazide  (HYDRODIURIL ) 25 MG tablet Take 1 tablet (25 mg total) by mouth daily.   ibuprofen  (ADVIL ) 800 MG tablet TAKE 1 TABLET BY MOUTH 3 TIMES A DAY WITH FOOD AS NEEDED   Insulin  Pen Needle (B-D UF III MINI PEN NEEDLES) 31G X 5 MM MISC 1 Needle by Does not apply route daily.   liraglutide   (VICTOZA ) 18 MG/3ML SOPN Inject 0.6 mg into the skin daily for 7 days, THEN 1.2 mg daily for 7 days, THEN 1.8 mg daily for 13 days.   telmisartan  (MICARDIS ) 40 MG tablet Take 1 tablet (40 mg total) by mouth daily.   [DISCONTINUED] Phentermine -Topiramate  ER 15-92 MG CP24 Take 1 capsule by mouth daily.   No facility-administered encounter medications on file as of 08/26/2024.     Follow-Up   Return in about 4 weeks (around 09/23/2024) for For Weight Mangement with Dr. Francyne.SABRA She was informed of the importance of frequent follow up visits to maximize her success with intensive lifestyle modifications for her multiple health conditions.  Attestation Statement   Reviewed by clinician on day  of visit: allergies, medications, problem list, medical history, surgical history, family history, social history, and previous encounter notes.     [name], MD

## 2024-08-27 ENCOUNTER — Telehealth (INDEPENDENT_AMBULATORY_CARE_PROVIDER_SITE_OTHER): Payer: Self-pay

## 2024-08-27 NOTE — Telephone Encounter (Signed)
 PA - Liraglutide  18 mg - obesity, HTN, PreD - statred

## 2024-08-28 ENCOUNTER — Encounter (INDEPENDENT_AMBULATORY_CARE_PROVIDER_SITE_OTHER): Payer: Self-pay | Admitting: Internal Medicine

## 2024-08-28 ENCOUNTER — Other Ambulatory Visit (INDEPENDENT_AMBULATORY_CARE_PROVIDER_SITE_OTHER): Payer: Self-pay | Admitting: Internal Medicine

## 2024-08-28 DIAGNOSIS — E66812 Obesity, class 2: Secondary | ICD-10-CM

## 2024-08-28 DIAGNOSIS — I10 Essential (primary) hypertension: Secondary | ICD-10-CM

## 2024-08-28 DIAGNOSIS — R7303 Prediabetes: Secondary | ICD-10-CM

## 2024-08-31 ENCOUNTER — Encounter (INDEPENDENT_AMBULATORY_CARE_PROVIDER_SITE_OTHER): Payer: Self-pay

## 2024-08-31 NOTE — Telephone Encounter (Signed)
 PA - Liraglutide  denied, pt notified

## 2024-09-03 ENCOUNTER — Other Ambulatory Visit (HOSPITAL_COMMUNITY): Payer: Self-pay

## 2024-09-03 ENCOUNTER — Other Ambulatory Visit: Payer: Self-pay

## 2024-09-07 ENCOUNTER — Encounter: Payer: Self-pay | Admitting: Nurse Practitioner

## 2024-09-08 ENCOUNTER — Other Ambulatory Visit (HOSPITAL_COMMUNITY): Payer: Self-pay

## 2024-09-08 ENCOUNTER — Other Ambulatory Visit: Payer: Self-pay

## 2024-09-08 DIAGNOSIS — I10 Essential (primary) hypertension: Secondary | ICD-10-CM

## 2024-09-08 MED ORDER — HYDROCHLOROTHIAZIDE 25 MG PO TABS: 25.0000 mg | ORAL_TABLET | Freq: Every day | ORAL | 1 refills | Status: AC

## 2024-09-08 MED ORDER — TELMISARTAN 40 MG PO TABS: 40.0000 mg | ORAL_TABLET | Freq: Every day | ORAL | 1 refills | Status: AC

## 2024-09-14 ENCOUNTER — Other Ambulatory Visit (HOSPITAL_COMMUNITY): Payer: Self-pay

## 2024-09-15 ENCOUNTER — Other Ambulatory Visit (HOSPITAL_COMMUNITY): Payer: Self-pay

## 2024-09-23 ENCOUNTER — Ambulatory Visit (INDEPENDENT_AMBULATORY_CARE_PROVIDER_SITE_OTHER): Admitting: Internal Medicine

## 2024-09-23 ENCOUNTER — Encounter (INDEPENDENT_AMBULATORY_CARE_PROVIDER_SITE_OTHER): Payer: Self-pay

## 2024-10-02 ENCOUNTER — Other Ambulatory Visit (HOSPITAL_COMMUNITY): Payer: Self-pay

## 2024-10-05 ENCOUNTER — Telehealth (HOSPITAL_COMMUNITY): Payer: Self-pay

## 2024-10-05 ENCOUNTER — Encounter (INDEPENDENT_AMBULATORY_CARE_PROVIDER_SITE_OTHER): Payer: Self-pay | Admitting: Internal Medicine

## 2024-10-05 ENCOUNTER — Ambulatory Visit (INDEPENDENT_AMBULATORY_CARE_PROVIDER_SITE_OTHER): Admitting: Internal Medicine

## 2024-10-05 ENCOUNTER — Other Ambulatory Visit (HOSPITAL_COMMUNITY): Payer: Self-pay

## 2024-10-05 ENCOUNTER — Encounter (HOSPITAL_COMMUNITY): Payer: Self-pay

## 2024-10-05 VITALS — BP 121/80 | HR 83 | Temp 98.0°F | Ht 68.0 in | Wt 238.0 lb

## 2024-10-05 DIAGNOSIS — I10 Essential (primary) hypertension: Secondary | ICD-10-CM | POA: Diagnosis not present

## 2024-10-05 DIAGNOSIS — Z6835 Body mass index (BMI) 35.0-35.9, adult: Secondary | ICD-10-CM

## 2024-10-05 DIAGNOSIS — E66812 Obesity, class 2: Secondary | ICD-10-CM | POA: Diagnosis not present

## 2024-10-05 DIAGNOSIS — R7303 Prediabetes: Secondary | ICD-10-CM

## 2024-10-05 MED ORDER — ZEPBOUND 2.5 MG/0.5ML ~~LOC~~ SOAJ: 2.5000 mg | SUBCUTANEOUS | 0 refills | Status: DC

## 2024-10-05 MED ORDER — ZEPBOUND 2.5 MG/0.5ML ~~LOC~~ SOAJ
2.5000 mg | SUBCUTANEOUS | 0 refills | Status: DC
  Filled 2024-10-05 (×2): qty 2, 28d supply, fill #0

## 2024-10-05 NOTE — Assessment & Plan Note (Signed)
 Improved.  Previously on Wegovy  for pharmacoprophylaxis.  No longer covered.  We will see if her insurance covers Zepbound .  Please see above under pharmacotherapy

## 2024-10-05 NOTE — Assessment & Plan Note (Addendum)
 Class 2 obesity with associated prediabetes and hypertension. Previous weight loss of 37 pounds on Wegovy , with subsequent 15-pound regain after discontinuation as of July 2025. Currently not on any weight management medication.  We had recommended last year liraglutide  but this has not been filled.  She had been on Qsymia  but medication became cost prohibitive.  She is following a 1200 calorie nutrition plan 60% of the time and exercising 3-4 days a week for 90 minutes. Discussed options including Zepbound , Wegovy , and generic topiramate -phentermine . - If Zepbound  is not covered, will check insurance coverage for Wegovy . - If neither is covered, will consider Wegovy  pill through drug company.  Patient would pay for medication out-of-pocket -We will avoid Qsymia  for now due to history of high blood pressure but we may also consider using generics if needed. - Encouraged increased protein and fiber intake to manage hunger and fullness. - Scheduled follow-up in 4 weeks.

## 2024-10-05 NOTE — Assessment & Plan Note (Signed)
 Vitals:   10/05/24 0700  BP: 121/80   Stable. Blood pressure control expected to improve with continued weight reduction. No medication changes indicated today; continue current antihypertensive regimen and home BP monitoring.

## 2024-10-05 NOTE — Progress Notes (Signed)
 "  Jane Parker, MD ABIM, ABOM  [address] [name], [name], [name] [address] Office: [phone]  /  Fax: [phone]    Weight Summary and Body Composition Analysis (BIA)  Vitals Temp: 98 F (36.7 C) BP: 121/80 Pulse Rate: 83 SpO2: 100 %   Anthropometric Measurements Height: 5' 8 (1.727 m) Weight: 238 lb (108 kg) BMI (Calculated): 36.2 Weight at Last Visit: 230 lb Weight Lost Since Last Visit: 0 lb Weight Gained Since Last Visit: 8 lb Starting Weight: 260 lb Total Weight Loss (lbs): 32 lb (14.5 kg) Peak Weight: 296 lb   Body Composition  Body Fat %: 42 % Fat Mass (lbs): 100 lbs Muscle Mass (lbs): 131.2 lbs Total Body Water (lbs): 88.2 lbs Visceral Fat Rating : 121    RMR: 1469  Today's Visit #: 20  Starting Date: 01/06/23   Subjective   Chief Complaint: Obesity  Interval History  Discussed the use of AI scribe software for clinical note transcription with the patient, who gave verbal consent to proceed.  History of Present Illness Jane Armstrong is a 54 year old female with prediabetes and hypertension who presents for medical weight management.  Since last office visit she has gained 8 pounds after successfully losing 41 pounds to be a medically supervised weight management plan inclusive of Wegovy .   She has been following a 1200 calorie nutrition plan with fair adherence, and exercises three to four days a week for 90 minutes. She experienced significant weight loss of 41 pounds while on Wegovy , but after discontinuing the medication in July, she has regained approximately 15 pounds. Her weight was previously down to 223 pounds in May/June, but has since increased.  She has prediabetes and hypertension her mom also has diabetes.  She is not currently taking any weight management medication and is exploring options for coverage of GLP-1 medications this year. Her current medications include telmisartan  and hydrochlorothiazide  for  hypertension.  Her family history includes her mother, who uses Mounjaro  for diabetes management and has experienced some weight loss with it. Her mother has type 2 diabetes.     [] Denies [x] Reports []  Improved problems with appetite and hunger signals.  [] Denies [x] Reports []  Improved problems with satiety and satiation.  [] Denies [x] Reports []  Improved problems with eating patterns and portion control.  [] Denies [x] Reports []  Improved abnormal cravings   Challenges affecting patient progress: Stress problems with antiobesity medication coverage   Pharmacotherapy for weight management: None, previously on Wegovy  after that transition to Qsymia  but medication became cost prohibitive.  Assessment and Plan   Treatment Plan For Obesity:  Recommended Dietary Goals  Jane Armstrong is currently in the action stage of change. As such, her goal is to continue weight management plan. She has agreed to: follow the Category 2 plan - 1200 kcal per day, incorporate prepackaged healthy meals for convenience, and incorporate 1-2 meal replacements a day for convenience   Behavioral Health and Counseling  We discussed the following behavioral modification strategies today: increasing lean protein intake to established goals, decreasing simple carbohydrates , increasing vegetables, increasing lower glycemic fruits, increasing fiber rich foods, avoiding skipping meals, increasing water intake , and work on meal planning and preparation.  Additional education and resources provided today: None  Recommended Physical Activity Goals  Jane Armstrong has been advised to work up to 240 minutes of combined endurance and strengthening exercises per week for cardiovascular health, weight loss maintenance and preservation of muscle mass.  She has agreed to :  Increase volume of physical  activity to a goal of 240 minutes a week and Combine aerobic and strengthening exercises for efficiency and improved cardiometabolic  health.  Medical Interventions and Pharmacotherapy  We have reviewed current or available treatment options to assist her with their weight loss efforts and we agreed to : We discussed various medication options to help Jane Armstrong with her weight loss efforts and we both agreed to starting anti-obesity pharmacotherapy. In addition to a prescribed reduced calorie nutrition plan (RCNP), behavioral strategies and physical activity, Jane Armstrong would benefit from pharmacotherapy to assist with abnormal hunger signals, impaired satiety and cravings. This will reduce obesity-related health risks by inducing weight loss, and help reduce food consumption and adherence to Jane Armstrong). It may also improve QOL by improving self-confidence and reduce the setbacks associated with metabolic adaptations.  she also has high risk comorbidities associated with her obesity including: Hypertension and Prediabetes. All of these conditions increase her risk for future complications and are either improved or potentially reverse through sustained weight loss.  She has been on Wegovy  and has lost 41 pounds on medically supervised weight management plan inclusive of GLP-1.  Because of loss of coverage she has experienced some weight regain.  This increases her risk of complications associated with her cardiometabolic conditions.  GLP-1 receptor agonists have been shown in robust clinical trials to: Enhance satiety and delay gastric emptying, resulting in reduced caloric intake Improve insulin  sensitivity and glycemic control Promote clinically meaningful weight loss (>=5-22%) Reduce cardiovascular risk markers, including blood pressure, lipids, and inflammation  Given Jane Armstrong's clinical profile--GLP-1 therapy is both indicated and expected to provide multifactorial benefit. The medication's mechanism aligns precisely with the patient's needs and addresses the physiologic underpinnings of weight gain.   After a detailed discussion covering  treatment rationale, mechanism of action, expected outcomes, risks, and long-term use considerations, shared decision-making was used to initiate Zepbound  2.5 mg once a week. The importance of ongoing lifestyle management and long-term adherence was emphasized, as was the potential for weight regain following discontinuation.  The delivery device was demonstrated, and using the teach-back method, Jane Armstrong successfully demonstrated proper injection technique. Ongoing monitoring and follow-up are planned to assess tolerability, clinical response, and reinforce behavioral strategies.   If Zepbound  is not covered and Wegovy  is preferred and she will be restarted on Wegovy .  If she lacks insurance coverage for either then she is willing to pay for medication in the pill form out-of-pocket understanding long-term cost associated with the use of GLP-1 for weight management  Associated Conditions Impacted by Medically Supervised Weight Management Plan  Assessment & Plan Class 2 severe obesity with serious comorbidity and body mass index (BMI) of 35.0 to 35.9 in adult, unspecified obesity type Class 2 obesity with associated prediabetes and hypertension. Previous weight loss of 37 pounds on Wegovy , with subsequent 15-pound regain after discontinuation as of July 2025. Currently not on any weight management medication.  We had recommended last year liraglutide  but this has not been filled.  She had been on Qsymia  but medication became cost prohibitive.  She is following a 1200 calorie nutrition plan 60% of the time and exercising 3-4 days a week for 90 minutes. Discussed options including Zepbound , Wegovy , and generic topiramate -phentermine . - If Zepbound  is not covered, will check insurance coverage for Wegovy . - If neither is covered, will consider Wegovy  pill through drug company.  Patient would pay for medication out-of-pocket -We will avoid Qsymia  for now due to history of high blood pressure but we may also  consider using  generics if needed. - Encouraged increased protein and fiber intake to manage hunger and fullness. - Scheduled follow-up in 4 weeks. Prediabetes Improved.  Previously on Wegovy  for pharmacoprophylaxis.  No longer covered.  We will see if her insurance covers Zepbound .  Please see above under pharmacotherapy Essential hypertension Vitals:   10/05/24 0700  BP: 121/80   Stable. Blood pressure control expected to improve with continued weight reduction. No medication changes indicated today; continue current antihypertensive regimen and home BP monitoring.           Objective   Physical Exam:  Blood pressure 121/80, pulse 83, temperature 98 F (36.7 C), height 5' 8 (1.727 m), weight 238 lb (108 kg), last menstrual period 03/21/2024, SpO2 100%. Body mass index is 36.19 kg/m.  General: She is overweight, cooperative, alert, well developed, and in no acute distress. PSYCH: Has normal mood, affect and thought process.   HEENT: EOMI, sclerae are anicteric. Lungs: Normal breathing effort, no conversational dyspnea. Extremities: No edema.  Neurologic: No gross sensory or motor deficits. No tremors or fasciculations noted.    Diagnostic Data Reviewed:  BMET    Component Value Date/Time   NA 138 08/17/2024 0000   K 4.0 08/17/2024 0000   CL 103 08/17/2024 0000   CO2 21 08/17/2024 0000   GLUCOSE 84 08/17/2024 0000   BUN 12 08/17/2024 0000   CREATININE 0.94 08/17/2024 0000   CALCIUM 9.7 08/17/2024 0000   GFRNONAA 82 11/10/2020 1045   GFRAA 94 11/10/2020 1045   Lab Results  Component Value Date   HGBA1C 5.7 (H) 08/17/2024   HGBA1C 5.8 04/18/2018   Lab Results  Component Value Date   INSULIN  22.4 12/26/2022   INSULIN  5.6 07/12/2020   Lab Results  Component Value Date   TSH 1.110 12/26/2022   CBC    Component Value Date/Time   WBC 8.2 08/17/2024 0000   RBC 5.03 08/17/2024 0000   HGB 14.3 08/17/2024 0000   HCT 43.1 08/17/2024 0000   PLT 279  08/17/2024 0000   MCV 86 08/17/2024 0000   MCH 28.4 08/17/2024 0000   MCHC 33.2 08/17/2024 0000   RDW 13.0 08/17/2024 0000   Iron Studies No results found for: IRON, TIBC, FERRITIN, IRONPCTSAT Lipid Panel     Component Value Date/Time   CHOL 128 02/05/2024 0920   TRIG 56 02/05/2024 0920   HDL 56 02/05/2024 0920   CHOLHDL 2.3 02/05/2024 0920   LDLCALC 60 02/05/2024 0920   Hepatic Function Panel     Component Value Date/Time   PROT 7.6 02/05/2024 0920   ALBUMIN 4.5 02/05/2024 0920   AST 18 02/05/2024 0920   ALT 20 02/05/2024 0920   ALKPHOS 88 02/05/2024 0920   BILITOT 0.4 02/05/2024 0920      Component Value Date/Time   TSH 1.110 12/26/2022 0927   Nutritional Lab Results  Component Value Date   VD25OH 26.8 (L) 08/17/2024   VD25OH 40.4 02/05/2024   VD25OH 45.4 07/25/2023    Medications: Outpatient Encounter Medications as of 10/05/2024  Medication Sig   cetirizine  (ZYRTEC ) 10 MG tablet Take 1 tablet (10 mg total) by mouth daily.   EPINEPHrine  0.3 mg/0.3 mL IJ SOAJ injection Inject 0.3 mg into the muscle as needed for anaphylaxis. Use as directed   ergocalciferol  (VITAMIN D2) 1.25 MG (50000 UT) capsule Take 1 capsule (50,000 Units total) by mouth once a week.   hydrochlorothiazide  (HYDRODIURIL ) 25 MG tablet Take 1 tablet (25 mg total) by mouth daily.  Insulin  Pen Needle (B-D UF III MINI PEN NEEDLES) 31G X 5 MM MISC 1 Needle by Does not apply route daily.   telmisartan  (MICARDIS ) 40 MG tablet Take 1 tablet (40 mg total) by mouth daily.   [DISCONTINUED] ibuprofen  (ADVIL ) 800 MG tablet TAKE 1 TABLET BY MOUTH 3 TIMES A DAY WITH FOOD AS NEEDED   [DISCONTINUED] liraglutide  (VICTOZA ) 18 MG/3ML SOPN Inject 0.6 mg into the skin daily for 7 days, THEN 1.2 mg daily for 7 days, THEN 1.8 mg daily for 13 days.   [DISCONTINUED] tirzepatide  (ZEPBOUND ) 2.5 MG/0.5ML Pen Inject 2.5 mg into the skin once a week.   tirzepatide  (ZEPBOUND ) 2.5 MG/0.5ML Pen Inject 2.5 mg into the skin  once a week.   No facility-administered encounter medications on file as of 10/05/2024.     Follow-Up   Return in about 4 weeks (around 11/02/2024).Jane Armstrong She was informed of the importance of frequent follow up visits to maximize her success with intensive lifestyle modifications for her multiple health conditions.  Attestation Statement   Reviewed by clinician on day of visit: allergies, medications, problem list, medical history, surgical history, family history, social history, and previous encounter notes.     Jane Parker, MD ABIM, ABOM "

## 2024-10-05 NOTE — Telephone Encounter (Signed)
 Pharmacy Patient Advocate Encounter   Received notification from Pt Calls Messages that prior authorization for Zepbound  2.5 mg/0.5 ml auto injectors is required/requested.   Insurance verification completed.   The patient is insured through [employer].   Per test claim: Per test claim, medication is not covered due to plan/benefit exclusion, PA not submitted at this time   *plan doesn't cover this medication for weight loss only OSA, but I do not see documentation of that diagnosis on her chart

## 2024-10-05 NOTE — Telephone Encounter (Signed)
 PA request has been Received. New Encounter has been or will be created for follow up. For additional info see Pharmacy Prior Auth telephone encounter from 10/05/24.

## 2024-10-06 ENCOUNTER — Encounter (INDEPENDENT_AMBULATORY_CARE_PROVIDER_SITE_OTHER): Payer: Self-pay | Admitting: Internal Medicine

## 2024-10-06 ENCOUNTER — Ambulatory Visit: Admitting: Nurse Practitioner

## 2024-10-06 ENCOUNTER — Other Ambulatory Visit: Payer: Self-pay

## 2024-10-06 ENCOUNTER — Other Ambulatory Visit (INDEPENDENT_AMBULATORY_CARE_PROVIDER_SITE_OTHER): Payer: Self-pay | Admitting: Internal Medicine

## 2024-10-06 ENCOUNTER — Other Ambulatory Visit (HOSPITAL_COMMUNITY): Payer: Self-pay

## 2024-10-06 DIAGNOSIS — E66812 Obesity, class 2: Secondary | ICD-10-CM

## 2024-10-06 DIAGNOSIS — R7303 Prediabetes: Secondary | ICD-10-CM

## 2024-10-06 DIAGNOSIS — I10 Essential (primary) hypertension: Secondary | ICD-10-CM

## 2024-10-06 MED ORDER — WEGOVY 1.5 MG PO TABS: 1.5000 mg | ORAL_TABLET | Freq: Every day | ORAL | 0 refills | Status: AC

## 2024-10-06 NOTE — Progress Notes (Signed)
 Insurance denied coverage for Zepbound .  We will prescribe Wegovy  1.5 mg once a day through [facility]

## 2024-10-14 ENCOUNTER — Encounter (INDEPENDENT_AMBULATORY_CARE_PROVIDER_SITE_OTHER): Payer: Self-pay | Admitting: Internal Medicine

## 2024-11-02 ENCOUNTER — Ambulatory Visit (INDEPENDENT_AMBULATORY_CARE_PROVIDER_SITE_OTHER): Admitting: Internal Medicine

## 2024-11-24 ENCOUNTER — Ambulatory Visit (INDEPENDENT_AMBULATORY_CARE_PROVIDER_SITE_OTHER): Admitting: Internal Medicine

## 2024-11-30 ENCOUNTER — Ambulatory Visit (INDEPENDENT_AMBULATORY_CARE_PROVIDER_SITE_OTHER): Admitting: Internal Medicine

## 2025-02-10 ENCOUNTER — Encounter: Payer: Self-pay | Admitting: Nurse Practitioner

## 2025-02-10 ENCOUNTER — Encounter: Admitting: Nurse Practitioner
